# Patient Record
Sex: Female | Born: 1945 | Race: White | Hispanic: No | Smoking: Never smoker
Health system: Southern US, Community
[De-identification: ages and names within clinical notes are randomized; demographics above are authoritative.]

## PROBLEM LIST (undated history)

## (undated) DIAGNOSIS — M1990 Unspecified osteoarthritis, unspecified site: Secondary | ICD-10-CM

## (undated) DIAGNOSIS — G2581 Restless legs syndrome: Secondary | ICD-10-CM

## (undated) DIAGNOSIS — J45909 Unspecified asthma, uncomplicated: Secondary | ICD-10-CM

## (undated) DIAGNOSIS — E785 Hyperlipidemia, unspecified: Secondary | ICD-10-CM

## (undated) DIAGNOSIS — C801 Malignant (primary) neoplasm, unspecified: Secondary | ICD-10-CM

## (undated) DIAGNOSIS — I10 Essential (primary) hypertension: Secondary | ICD-10-CM

## (undated) HISTORY — PX: KNEE SURGERY: SHX244

## (undated) HISTORY — PX: SHOULDER SURGERY: SHX246

## (undated) HISTORY — PX: ABDOMINAL HYSTERECTOMY: SHX81

---

## 2016-08-22 ENCOUNTER — Emergency Department (HOSPITAL_COMMUNITY): Payer: Medicare HMO

## 2016-08-22 ENCOUNTER — Encounter (HOSPITAL_COMMUNITY): Payer: Self-pay | Admitting: Family Medicine

## 2016-08-22 ENCOUNTER — Emergency Department (HOSPITAL_COMMUNITY)
Admission: EM | Admit: 2016-08-22 | Discharge: 2016-08-23 | Disposition: A | Discharge status: Home / Self Care | Payer: Medicare HMO | Attending: Emergency Medicine | Admitting: Emergency Medicine

## 2016-08-22 DIAGNOSIS — Y999 Unspecified external cause status: Secondary | ICD-10-CM | POA: Insufficient documentation

## 2016-08-22 DIAGNOSIS — M2500 Hemarthrosis, unspecified joint: Secondary | ICD-10-CM

## 2016-08-22 DIAGNOSIS — J45909 Unspecified asthma, uncomplicated: Secondary | ICD-10-CM | POA: Diagnosis not present

## 2016-08-22 DIAGNOSIS — S76102A Unspecified injury of left quadriceps muscle, fascia and tendon, initial encounter: Secondary | ICD-10-CM | POA: Diagnosis not present

## 2016-08-22 DIAGNOSIS — S0990XA Unspecified injury of head, initial encounter: Secondary | ICD-10-CM | POA: Insufficient documentation

## 2016-08-22 DIAGNOSIS — Z96652 Presence of left artificial knee joint: Secondary | ICD-10-CM | POA: Insufficient documentation

## 2016-08-22 DIAGNOSIS — S76109A Unspecified injury of unspecified quadriceps muscle, fascia and tendon, initial encounter: Secondary | ICD-10-CM

## 2016-08-22 DIAGNOSIS — Y9301 Activity, walking, marching and hiking: Secondary | ICD-10-CM | POA: Diagnosis not present

## 2016-08-22 DIAGNOSIS — I10 Essential (primary) hypertension: Secondary | ICD-10-CM | POA: Diagnosis not present

## 2016-08-22 DIAGNOSIS — Z853 Personal history of malignant neoplasm of breast: Secondary | ICD-10-CM | POA: Insufficient documentation

## 2016-08-22 DIAGNOSIS — W0110XA Fall on same level from slipping, tripping and stumbling with subsequent striking against unspecified object, initial encounter: Secondary | ICD-10-CM | POA: Diagnosis not present

## 2016-08-22 DIAGNOSIS — S8992XA Unspecified injury of left lower leg, initial encounter: Secondary | ICD-10-CM | POA: Diagnosis present

## 2016-08-22 DIAGNOSIS — Y929 Unspecified place or not applicable: Secondary | ICD-10-CM | POA: Insufficient documentation

## 2016-08-22 HISTORY — DX: Unspecified asthma, uncomplicated: J45.909

## 2016-08-22 HISTORY — DX: Essential (primary) hypertension: I10

## 2016-08-22 HISTORY — DX: Unspecified osteoarthritis, unspecified site: M19.90

## 2016-08-22 HISTORY — DX: Restless legs syndrome: G25.81

## 2016-08-22 HISTORY — DX: Malignant (primary) neoplasm, unspecified: C80.1

## 2016-08-22 HISTORY — DX: Hyperlipidemia, unspecified: E78.5

## 2016-08-22 NOTE — ED Notes (Signed)
Bed: WA01 Expected date:  Expected time:  Means of arrival:  Comments: 71 yr old left leg pain fall

## 2016-08-22 NOTE — ED Triage Notes (Signed)
Patient is from her daughters home and transported via [facility]. Patient had a left knee replacement on 07/19/2016. Patient experienced a fall while wearing socks,. Pt is complaining of left femur pain and has pain tenderness.

## 2016-08-22 NOTE — ED Provider Notes (Addendum)
[facility] DEPT Provider Note   CSN: [id number] Arrival date & time: 08/22/16  2243  By signing my name below, I, Oleh Genin, attest that this documentation has been prepared under the direction and in the presence of Conrad, Khyran Riera, MD. Electronically Signed: Oleh Genin, Scribe. 08/22/16. 11:54 PM.   History   Chief Complaint Chief Complaint  Patient presents with  . Fall    HPI ISZABELLA HEBENSTREIT is a 71 y.o. 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 This patient states that she slipped and fell while walking with socks on hardwood flooring. She struck the back of her head. No LOC. At interview, she is primarily reporting severe pain to the L anterior, mid thigh extending to the L knee. She did have a recent procedure as noted. She states she stopped doing rehab over a week ago secondary to injury the left quadriceps muscle.  She denies any loss of function or sensation distally. She is reporting mild head pain following her fall. She takes 81mg  aspirin once daily and is taking oxycodone at home as needed for the pain.  The history is provided by the patient. No language interpreter was used.  Fall  This is a new problem. The current episode started 3 to 5 hours ago. The problem occurs constantly. The problem has not changed since onset.Pertinent negatives include no chest pain, no abdominal pain and no shortness of breath. Associated symptoms comments: L lower extremity pain. The symptoms are aggravated by standing. Nothing relieves the symptoms. She has tried nothing for the symptoms. The treatment provided no relief.    Past Medical History:  Diagnosis Date  . Arthritis   . Asthma   . 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 Hyperlipemia   . Hypertension   . Restless leg     There are no active problems to display for this patient.   Past Surgical History:  Procedure Laterality Date  . ABDOMINAL  HYSTERECTOMY    . KNEE SURGERY Left   . SHOULDER SURGERY Right     OB History    No data available       Home Medications    Prior to Admission medications   Not on File    Family History History reviewed. No pertinent family history.  Social History Social History  Substance Use Topics  . Smoking status: Never Smoker  . Smokeless tobacco: Never Used  . Alcohol use No     Allergies   Patient has no allergy information on record.   Review of Systems Review of Systems  Constitutional: Negative for chills and fever.  HENT: Negative for ear pain and sore throat.   Eyes: Negative for pain and visual disturbance.  Respiratory: Negative for cough and shortness of breath.   Cardiovascular: Negative for chest pain and palpitations.  Gastrointestinal: Negative for abdominal pain and vomiting.  Genitourinary: Negative for dysuria and hematuria.  Musculoskeletal: Negative for arthralgias and back pain.       Severe leg pain per HPI. Mild head pain  Skin: Negative for color change and rash.  Neurological: Negative for seizures and syncope.  All other systems reviewed and are negative.    Physical Exam Updated Vital Signs BP 120/61   Pulse 73   Temp 98 F (36.7 C)   Resp 20   Ht 5\' 2"  (1.575 m)   Wt 211 lb (95.7 kg)   SpO2  98% Comment: RA  BMI 38.59 kg/m   Physical Exam  Constitutional: She is oriented to person, place, and time. She appears well-developed and well-nourished. No distress.  HENT:  Head: Normocephalic and atraumatic.  Mouth/Throat: Oropharynx is clear and moist.  Eyes: Conjunctivae and EOM are normal. Pupils are equal, round, and reactive to light.  Neck: Normal range of motion. Neck supple.  Cardiovascular: Normal rate and regular rhythm.   No murmur heard. Pulmonary/Chest: Effort normal and breath sounds normal. No respiratory distress. She has no wheezes. She has no rales.  Abdominal: Soft. Bowel sounds are normal. She exhibits no mass.  There is no tenderness. There is no rebound and no guarding.  Musculoskeletal: She exhibits no edema.       Left knee: She exhibits decreased range of motion, swelling, effusion and ecchymosis. She exhibits no deformity, no laceration, no erythema, normal patellar mobility and normal meniscus.       Left upper leg: She exhibits no bony tenderness, no deformity and no laceration.  Quadriceps engages normally without balling up of the muscle   Neurological: She is alert and oriented to person, place, and time. She has normal reflexes. She displays normal reflexes. She exhibits normal muscle tone. Coordination normal.  Skin: Skin is warm and dry. Capillary refill takes less than 2 seconds.  There is a surgical scar to the L knee that is clean, dry, and intact.  Psychiatric: She has a normal mood and affect.  Nursing note and vitals reviewed.    ED Treatments / Results   Vitals:   08/22/16 2245 08/23/16 0126  BP: 120/61 122/74  Pulse: 73 74  Resp: 20 16  Temp: 98 F (36.7 C)    Radiology Dg Hip Unilat With Pelvis 2-3 Views Left  Result Date: 08/22/2016 CLINICAL DATA:  Fall with pain to the distal femur EXAM: DG HIP (WITH OR WITHOUT PELVIS) 2-3V LEFT COMPARISON:  None. FINDINGS: SI joint arthritis. Pubic symphysis and rami appear intact. Both femoral heads project in joint. No acute displaced fracture or malalignment. IMPRESSION: No definite acute osseous abnormality Electronically Signed   By: Donavan Foil M.D.   On: 08/22/2016 23:53   Dg Femur Min 2 Views Left  Result Date: 08/22/2016 CLINICAL DATA:  Pain status post fall EXAM: LEFT FEMUR 2 VIEWS COMPARISON:  07/19/2016 FINDINGS: Status post left knee replacement with intact appearing hardware. No definitive fracture is seen. There is soft tissue swelling superior to the knee in addition to knee effusion. Sub patellar lucency could relate to the presence of effusion with meniscus level. IMPRESSION: Status post left knee replacement. No  definite acute osseous abnormality. Moderate amount of suprapatellar soft tissue swelling and probable effusion. Electronically Signed   By: Donavan Foil M.D.   On: 08/22/2016 23:56    Procedures Procedures (including critical care time)  Medications Ordered in ED  Medications  fentaNYL (SUBLIMAZE) injection 50 mcg (50 mcg Intravenous Given 08/23/16 0049)  methocarbamol (ROBAXIN) 1,000 mg in dextrose 5 % 50 mL IVPB (0 mg Intravenous Stopped 08/23/16 0133)  ketorolac (TORADOL) 30 MG/ML injection 15 mg (15 mg Intravenous Given 08/23/16 0352)  oxyCODONE-acetaminophen (PERCOCET/ROXICET) 5-325 MG per tablet 2 tablet (2 tablets Oral Given 08/23/16 0352)   EDP contacted Dr. Case (patient's orthopedic surgeon).  No one on call for the group per message at their office.    EDP contact [facility] and was advised by operator that there is no on call orthopedic surgeon for [place].  3:11 AM Discussed case with Dr. Alvan Dame of Orthopedics. Advises placing the patient in a knee immobilizer. Patient is stable for discharge. Continue taking your oxycodone. Schedule followup appointment with your orthopedic surgeon this week.  Family does not want to be discharged stating they cannot get the patient into the car.  EDP explained at time of discharge that our on call orthopedic surgeon advised outpatient care.  EDP stated we would get [facility] to take patient home and I would write for a walker for the patient.  Patient has a walker, family wants patient transferred to [facility] for boarding in their ED for orthopedics at [facility].  EDP advised that we cannot transfer for boarding in the ED at [facility] without an accepting physician.    725 case d/w Dr. Zenia Resides medical director at [facility] who concurs we cannot transfer patient  Final Clinical Impressions(s) / ED Diagnoses  Quadriceps injury, possible tendon injury but less likely as there is no bulging of the muscle.  Return immediately for fever > 101, coldness  of the extremity, numbness, intractable pain, weakness, bleeding or any concerns.  Follow up with Dr. Case in the am.    The patient is nontoxic-appearing on exam and vital signs are within normal limits.   I have reviewed the triage vital signs and the nursing notes. Pertinent labs &imaging results that were available during my care of the patient were reviewed by me and considered in my medical decision making (see chart for details).  After history, exam, and medical workup I feel the patient has been appropriately medically screened and is safe for discharge home. Pertinent diagnoses were discussed with the patient. Patient was given return precautions.   I personally performed the services described in this documentation, which was scribed in my presence. The recorded information has been reviewed and is accurate.      Malyssa Maris, MD 08/23/16 [place], Kit Brubacher, MD 08/23/16 6553

## 2016-08-23 ENCOUNTER — Encounter (HOSPITAL_COMMUNITY): Payer: Self-pay | Admitting: Emergency Medicine

## 2016-08-23 ENCOUNTER — Emergency Department (HOSPITAL_COMMUNITY): Payer: Medicare HMO

## 2016-08-23 DIAGNOSIS — S76102A Unspecified injury of left quadriceps muscle, fascia and tendon, initial encounter: Secondary | ICD-10-CM | POA: Diagnosis not present

## 2016-08-23 MED ORDER — OXYCODONE-ACETAMINOPHEN 5-325 MG PO TABS
2.0000 | ORAL_TABLET | Freq: Once | ORAL | Status: AC
  Administered 2016-08-23: 2 via ORAL
  Filled 2016-08-23: qty 2

## 2016-08-23 MED ORDER — METHOCARBAMOL 1000 MG/10ML IJ SOLN
1000.0000 mg | Freq: Once | INTRAVENOUS | Status: AC
  Administered 2016-08-23: 1000 mg via INTRAVENOUS
  Filled 2016-08-23: qty 10

## 2016-08-23 MED ORDER — METHOCARBAMOL 1000 MG/10ML IJ SOLN: 1000.0000 mg | Freq: Once | INTRAMUSCULAR | Status: DC

## 2016-08-23 MED ORDER — FENTANYL CITRATE (PF) 100 MCG/2ML IJ SOLN
50.0000 ug | Freq: Once | INTRAMUSCULAR | Status: AC
  Administered 2016-08-23: 50 ug via INTRAVENOUS
  Filled 2016-08-23: qty 2

## 2016-08-23 MED ORDER — KETOROLAC TROMETHAMINE 30 MG/ML IJ SOLN
15.0000 mg | Freq: Once | INTRAMUSCULAR | Status: AC
  Administered 2016-08-23: 15 mg via INTRAVENOUS
  Filled 2016-08-23: qty 1

## 2016-08-23 MED ORDER — OXYCODONE-ACETAMINOPHEN 5-325 MG PO TABS: 1.0000 | ORAL_TABLET | Freq: Four times a day (QID) | ORAL | 0 refills | Status: AC | PRN

## 2016-08-23 NOTE — ED Notes (Signed)
Pt's daughters are concerned about her going home and they will not be able to get her to her appt in the morning

## 2016-08-23 NOTE — ED Notes (Signed)
Gave report to [place], Therapist, sports. Patient is requesting pain medication and provider has been notified.

## 2016-08-23 NOTE — ED Notes (Signed)
Waiting on a call back from dispatch to see who will take patient home

## 2016-08-23 NOTE — ED Notes (Signed)
Bed: WA22 Expected date:  Expected time:  Means of arrival:  Comments: 

## 2018-03-13 IMAGING — CR DG HIP (WITH OR WITHOUT PELVIS) 2-3V*L*
3 series · 3 of 3 positions shown · non-contrast
Comparison: None.

CLINICAL DATA: Fall with pain to the distal femur

EXAM:
DG HIP (WITH OR WITHOUT PELVIS) 2-3V LEFT

[w hip lat left]
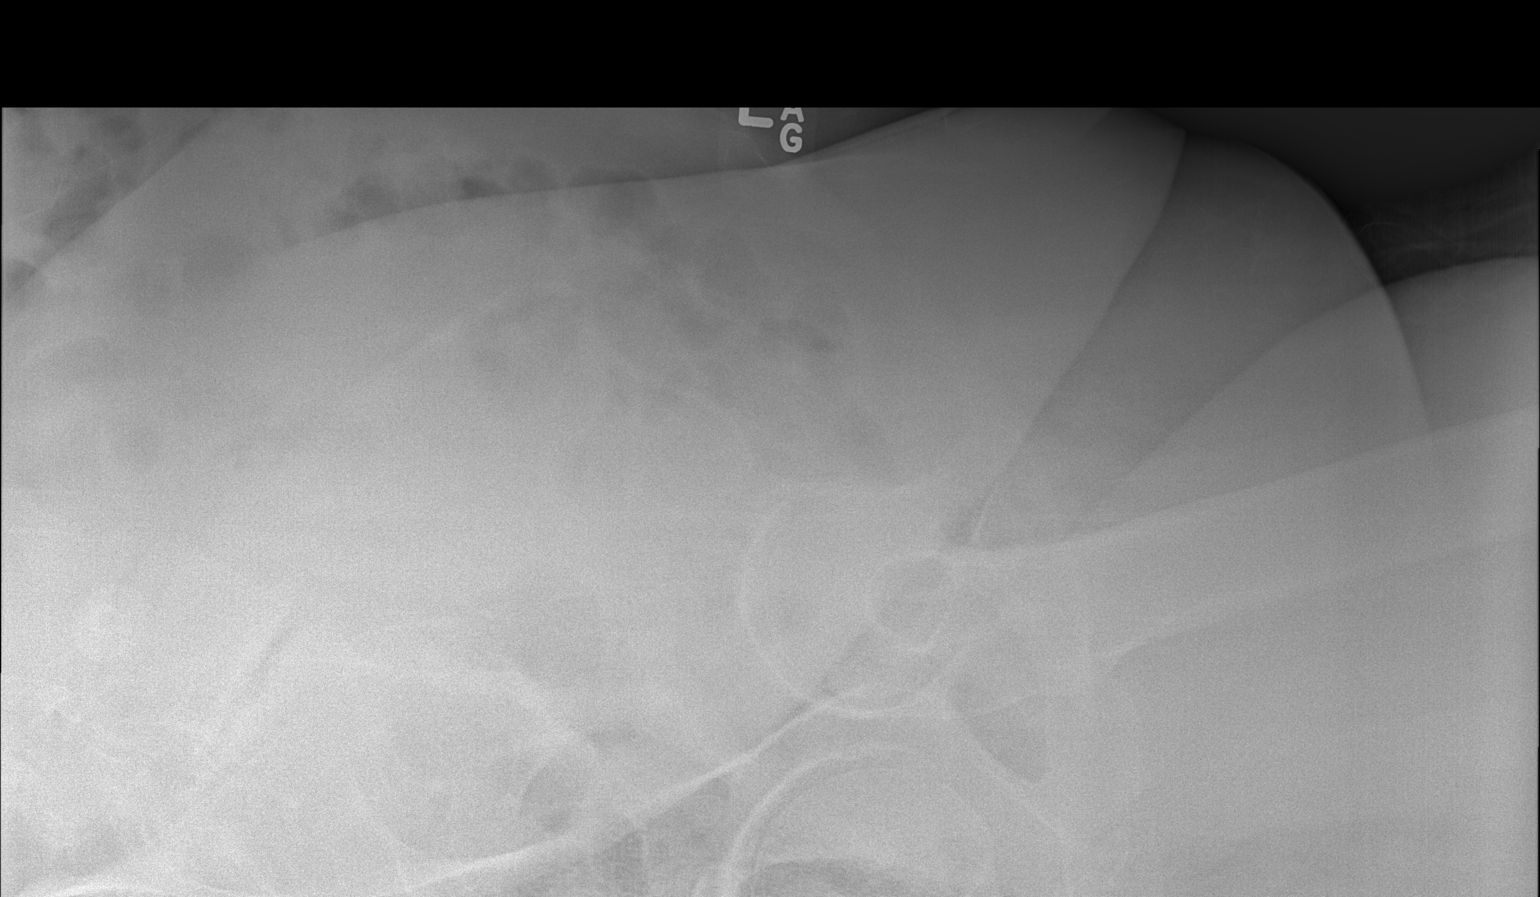

[x pelvis]
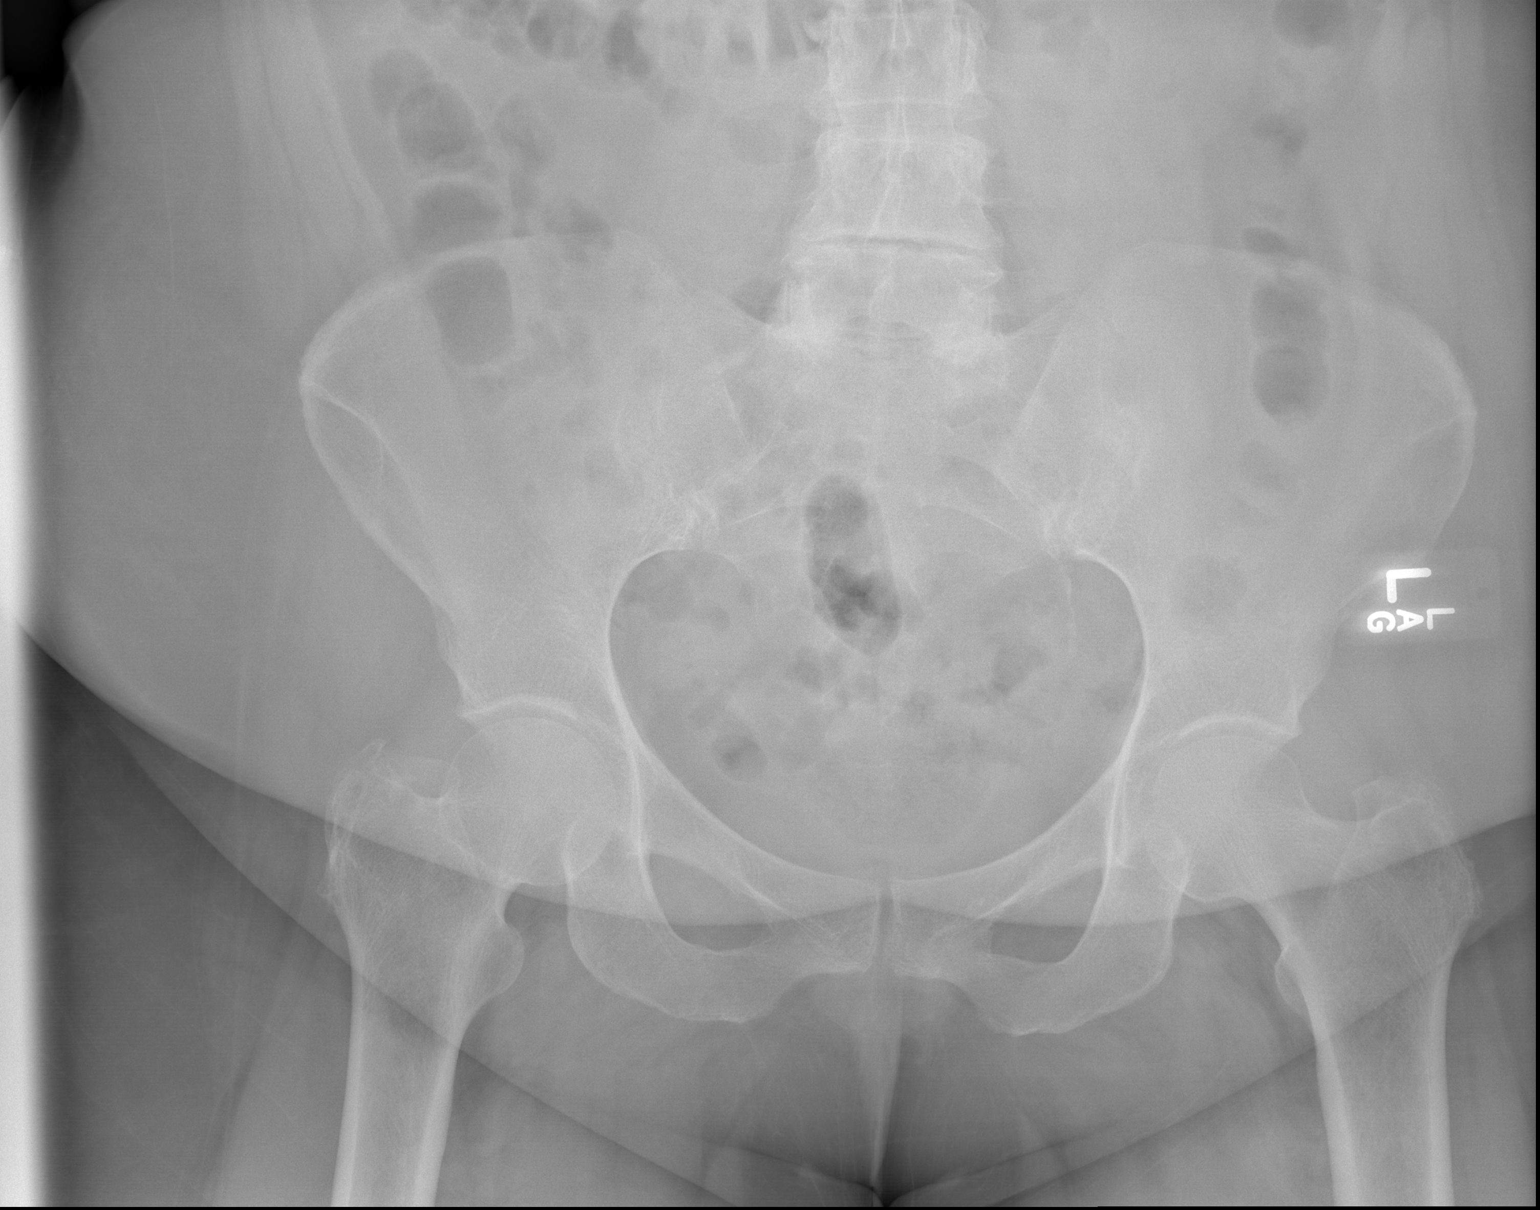

[x hip ap left]
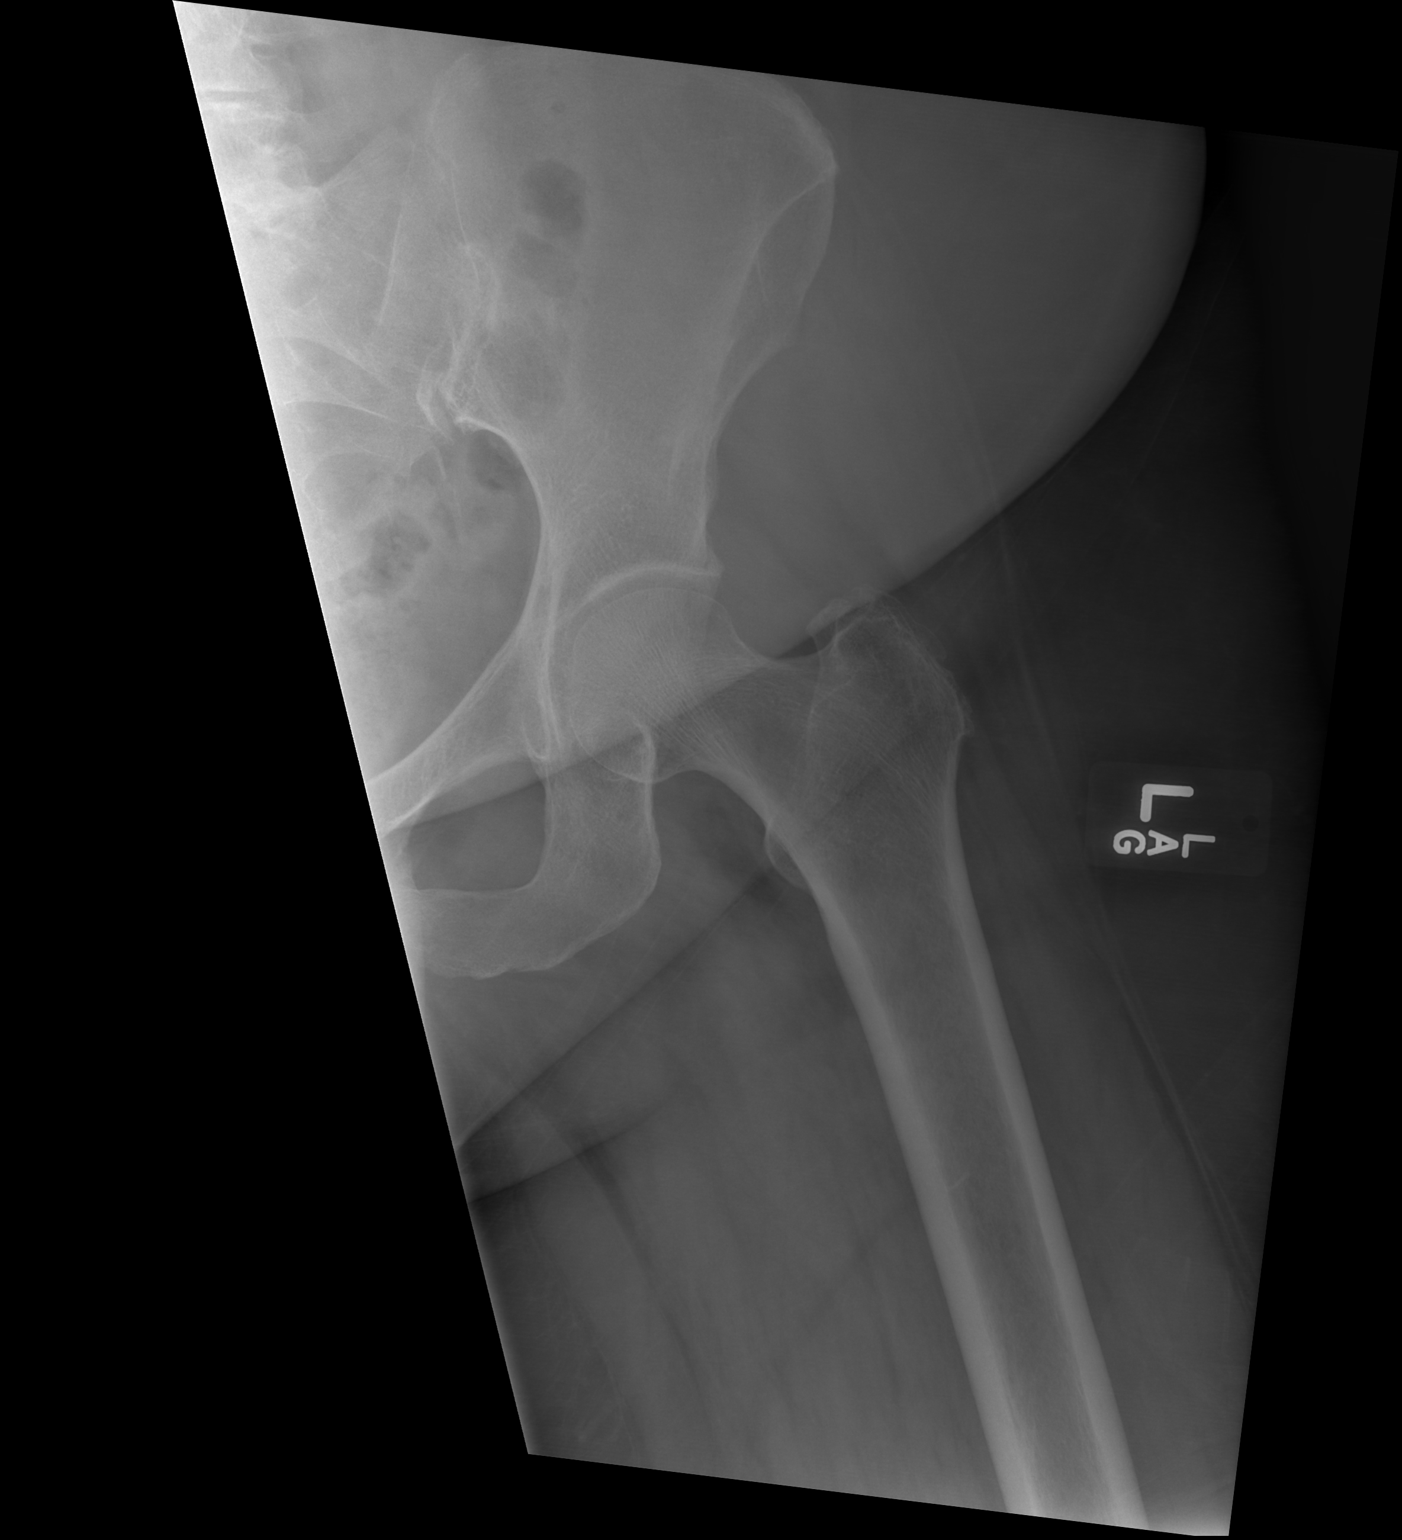

[3 of 3 positions shown; findings below may reference images not displayed]

FINDINGS: SI joint arthritis. Pubic symphysis and rami appear intact. Both
femoral heads project in joint. No acute displaced fracture or
malalignment.
IMPRESSION: No definite acute osseous abnormality

## 2018-04-12 DIAGNOSIS — J189 Pneumonia, unspecified organism: Secondary | ICD-10-CM | POA: Diagnosis not present

## 2018-04-12 DIAGNOSIS — Z6832 Body mass index (BMI) 32.0-32.9, adult: Secondary | ICD-10-CM | POA: Diagnosis not present

## 2018-04-12 DIAGNOSIS — R05 Cough: Secondary | ICD-10-CM | POA: Diagnosis not present

## 2018-04-19 DIAGNOSIS — S8002XD Contusion of left knee, subsequent encounter: Secondary | ICD-10-CM | POA: Diagnosis not present

## 2018-06-12 DIAGNOSIS — L03116 Cellulitis of left lower limb: Secondary | ICD-10-CM | POA: Diagnosis not present

## 2018-06-12 DIAGNOSIS — Z6833 Body mass index (BMI) 33.0-33.9, adult: Secondary | ICD-10-CM | POA: Diagnosis not present

## 2018-08-01 DIAGNOSIS — N183 Chronic kidney disease, stage 3 (moderate): Secondary | ICD-10-CM | POA: Diagnosis not present

## 2018-08-01 DIAGNOSIS — I10 Essential (primary) hypertension: Secondary | ICD-10-CM | POA: Diagnosis not present

## 2018-08-01 DIAGNOSIS — G2581 Restless legs syndrome: Secondary | ICD-10-CM | POA: Diagnosis not present

## 2018-08-01 DIAGNOSIS — G4733 Obstructive sleep apnea (adult) (pediatric): Secondary | ICD-10-CM | POA: Diagnosis not present

## 2018-08-01 DIAGNOSIS — E782 Mixed hyperlipidemia: Secondary | ICD-10-CM | POA: Diagnosis not present

## 2018-08-03 DIAGNOSIS — I700 Atherosclerosis of aorta: Secondary | ICD-10-CM | POA: Diagnosis not present

## 2018-08-03 DIAGNOSIS — Z6834 Body mass index (BMI) 34.0-34.9, adult: Secondary | ICD-10-CM | POA: Diagnosis not present

## 2018-08-03 DIAGNOSIS — E782 Mixed hyperlipidemia: Secondary | ICD-10-CM | POA: Diagnosis not present

## 2018-08-03 DIAGNOSIS — N183 Chronic kidney disease, stage 3 (moderate): Secondary | ICD-10-CM | POA: Diagnosis not present

## 2018-08-03 DIAGNOSIS — R05 Cough: Secondary | ICD-10-CM | POA: Diagnosis not present

## 2018-08-03 DIAGNOSIS — I10 Essential (primary) hypertension: Secondary | ICD-10-CM | POA: Diagnosis not present

## 2018-08-03 DIAGNOSIS — Z0001 Encounter for general adult medical examination with abnormal findings: Secondary | ICD-10-CM | POA: Diagnosis not present

## 2018-08-21 DIAGNOSIS — I10 Essential (primary) hypertension: Secondary | ICD-10-CM | POA: Diagnosis not present

## 2018-08-21 DIAGNOSIS — Z6834 Body mass index (BMI) 34.0-34.9, adult: Secondary | ICD-10-CM | POA: Diagnosis not present

## 2018-08-21 DIAGNOSIS — N183 Chronic kidney disease, stage 3 (moderate): Secondary | ICD-10-CM | POA: Diagnosis not present

## 2018-08-21 DIAGNOSIS — L03116 Cellulitis of left lower limb: Secondary | ICD-10-CM | POA: Diagnosis not present

## 2018-09-28 DIAGNOSIS — N183 Chronic kidney disease, stage 3 (moderate): Secondary | ICD-10-CM | POA: Diagnosis not present

## 2018-09-28 DIAGNOSIS — E782 Mixed hyperlipidemia: Secondary | ICD-10-CM | POA: Diagnosis not present

## 2018-09-28 DIAGNOSIS — I10 Essential (primary) hypertension: Secondary | ICD-10-CM | POA: Diagnosis not present

## 2018-10-03 DIAGNOSIS — N183 Chronic kidney disease, stage 3 (moderate): Secondary | ICD-10-CM | POA: Diagnosis not present

## 2018-10-03 DIAGNOSIS — E782 Mixed hyperlipidemia: Secondary | ICD-10-CM | POA: Diagnosis not present

## 2018-10-03 DIAGNOSIS — R05 Cough: Secondary | ICD-10-CM | POA: Diagnosis not present

## 2018-10-03 DIAGNOSIS — F332 Major depressive disorder, recurrent severe without psychotic features: Secondary | ICD-10-CM | POA: Diagnosis not present

## 2018-10-03 DIAGNOSIS — I10 Essential (primary) hypertension: Secondary | ICD-10-CM | POA: Diagnosis not present

## 2018-10-03 DIAGNOSIS — Z6834 Body mass index (BMI) 34.0-34.9, adult: Secondary | ICD-10-CM | POA: Diagnosis not present

## 2018-10-03 DIAGNOSIS — Z1389 Encounter for screening for other disorder: Secondary | ICD-10-CM | POA: Diagnosis not present

## 2018-10-03 DIAGNOSIS — I700 Atherosclerosis of aorta: Secondary | ICD-10-CM | POA: Diagnosis not present

## 2018-11-12 DIAGNOSIS — T84058A Periprosthetic osteolysis of other internal prosthetic joint, initial encounter: Secondary | ICD-10-CM | POA: Diagnosis not present

## 2018-11-12 DIAGNOSIS — T8454XA Infection and inflammatory reaction due to internal left knee prosthesis, initial encounter: Secondary | ICD-10-CM | POA: Diagnosis not present

## 2018-11-12 DIAGNOSIS — Z96659 Presence of unspecified artificial knee joint: Secondary | ICD-10-CM | POA: Diagnosis not present

## 2018-11-16 DIAGNOSIS — Z96659 Presence of unspecified artificial knee joint: Secondary | ICD-10-CM | POA: Diagnosis not present

## 2018-11-16 DIAGNOSIS — T8454XA Infection and inflammatory reaction due to internal left knee prosthesis, initial encounter: Secondary | ICD-10-CM | POA: Diagnosis not present

## 2018-11-16 DIAGNOSIS — T84058A Periprosthetic osteolysis of other internal prosthetic joint, initial encounter: Secondary | ICD-10-CM | POA: Diagnosis not present

## 2018-11-26 DIAGNOSIS — Z96659 Presence of unspecified artificial knee joint: Secondary | ICD-10-CM | POA: Diagnosis not present

## 2018-11-26 DIAGNOSIS — T8454XA Infection and inflammatory reaction due to internal left knee prosthesis, initial encounter: Secondary | ICD-10-CM | POA: Diagnosis not present

## 2018-11-30 DIAGNOSIS — Z96652 Presence of left artificial knee joint: Secondary | ICD-10-CM | POA: Diagnosis not present

## 2018-11-30 DIAGNOSIS — G2581 Restless legs syndrome: Secondary | ICD-10-CM | POA: Diagnosis not present

## 2018-11-30 DIAGNOSIS — E785 Hyperlipidemia, unspecified: Secondary | ICD-10-CM | POA: Diagnosis not present

## 2018-11-30 DIAGNOSIS — M1990 Unspecified osteoarthritis, unspecified site: Secondary | ICD-10-CM | POA: Diagnosis not present

## 2018-11-30 DIAGNOSIS — N183 Chronic kidney disease, stage 3 (moderate): Secondary | ICD-10-CM | POA: Diagnosis not present

## 2018-11-30 DIAGNOSIS — I129 Hypertensive chronic kidney disease with stage 1 through stage 4 chronic kidney disease, or unspecified chronic kidney disease: Secondary | ICD-10-CM | POA: Diagnosis not present

## 2018-11-30 DIAGNOSIS — Z01810 Encounter for preprocedural cardiovascular examination: Secondary | ICD-10-CM | POA: Diagnosis not present

## 2018-11-30 DIAGNOSIS — T8454XA Infection and inflammatory reaction due to internal left knee prosthesis, initial encounter: Secondary | ICD-10-CM | POA: Diagnosis not present

## 2018-11-30 DIAGNOSIS — D62 Acute posthemorrhagic anemia: Secondary | ICD-10-CM | POA: Diagnosis not present

## 2018-11-30 DIAGNOSIS — B9562 Methicillin resistant Staphylococcus aureus infection as the cause of diseases classified elsewhere: Secondary | ICD-10-CM | POA: Diagnosis not present

## 2018-12-03 DIAGNOSIS — Z96659 Presence of unspecified artificial knee joint: Secondary | ICD-10-CM | POA: Diagnosis not present

## 2018-12-03 DIAGNOSIS — Z01818 Encounter for other preprocedural examination: Secondary | ICD-10-CM | POA: Diagnosis not present

## 2018-12-03 DIAGNOSIS — T8454XA Infection and inflammatory reaction due to internal left knee prosthesis, initial encounter: Secondary | ICD-10-CM | POA: Diagnosis not present

## 2018-12-07 DIAGNOSIS — Z96652 Presence of left artificial knee joint: Secondary | ICD-10-CM | POA: Diagnosis not present

## 2018-12-07 DIAGNOSIS — N183 Chronic kidney disease, stage 3 (moderate): Secondary | ICD-10-CM | POA: Diagnosis not present

## 2018-12-07 DIAGNOSIS — M1990 Unspecified osteoarthritis, unspecified site: Secondary | ICD-10-CM | POA: Diagnosis not present

## 2018-12-07 DIAGNOSIS — I129 Hypertensive chronic kidney disease with stage 1 through stage 4 chronic kidney disease, or unspecified chronic kidney disease: Secondary | ICD-10-CM | POA: Diagnosis not present

## 2018-12-07 DIAGNOSIS — D62 Acute posthemorrhagic anemia: Secondary | ICD-10-CM | POA: Diagnosis not present

## 2018-12-07 DIAGNOSIS — T8454XA Infection and inflammatory reaction due to internal left knee prosthesis, initial encounter: Secondary | ICD-10-CM | POA: Diagnosis not present

## 2018-12-07 DIAGNOSIS — E785 Hyperlipidemia, unspecified: Secondary | ICD-10-CM | POA: Diagnosis not present

## 2018-12-07 DIAGNOSIS — G2581 Restless legs syndrome: Secondary | ICD-10-CM | POA: Diagnosis not present

## 2018-12-07 DIAGNOSIS — B9562 Methicillin resistant Staphylococcus aureus infection as the cause of diseases classified elsewhere: Secondary | ICD-10-CM | POA: Diagnosis not present

## 2018-12-11 DIAGNOSIS — Z96651 Presence of right artificial knee joint: Secondary | ICD-10-CM | POA: Diagnosis not present

## 2018-12-11 DIAGNOSIS — B9562 Methicillin resistant Staphylococcus aureus infection as the cause of diseases classified elsewhere: Secondary | ICD-10-CM | POA: Diagnosis not present

## 2018-12-11 DIAGNOSIS — E785 Hyperlipidemia, unspecified: Secondary | ICD-10-CM | POA: Diagnosis not present

## 2018-12-11 DIAGNOSIS — M1990 Unspecified osteoarthritis, unspecified site: Secondary | ICD-10-CM | POA: Diagnosis not present

## 2018-12-11 DIAGNOSIS — N179 Acute kidney failure, unspecified: Secondary | ICD-10-CM | POA: Diagnosis not present

## 2018-12-11 DIAGNOSIS — I129 Hypertensive chronic kidney disease with stage 1 through stage 4 chronic kidney disease, or unspecified chronic kidney disease: Secondary | ICD-10-CM | POA: Diagnosis not present

## 2018-12-11 DIAGNOSIS — G2581 Restless legs syndrome: Secondary | ICD-10-CM | POA: Diagnosis not present

## 2018-12-11 DIAGNOSIS — T8454XA Infection and inflammatory reaction due to internal left knee prosthesis, initial encounter: Secondary | ICD-10-CM | POA: Diagnosis not present

## 2018-12-11 DIAGNOSIS — D62 Acute posthemorrhagic anemia: Secondary | ICD-10-CM | POA: Diagnosis not present

## 2018-12-11 DIAGNOSIS — Z96652 Presence of left artificial knee joint: Secondary | ICD-10-CM | POA: Diagnosis not present

## 2018-12-11 DIAGNOSIS — N183 Chronic kidney disease, stage 3 (moderate): Secondary | ICD-10-CM | POA: Diagnosis not present

## 2018-12-11 DIAGNOSIS — M71162 Other infective bursitis, left knee: Secondary | ICD-10-CM | POA: Diagnosis not present

## 2018-12-11 DIAGNOSIS — I1310 Hypertensive heart and chronic kidney disease without heart failure, with stage 1 through stage 4 chronic kidney disease, or unspecified chronic kidney disease: Secondary | ICD-10-CM | POA: Diagnosis not present

## 2018-12-12 DIAGNOSIS — D62 Acute posthemorrhagic anemia: Secondary | ICD-10-CM | POA: Diagnosis not present

## 2018-12-12 DIAGNOSIS — I1310 Hypertensive heart and chronic kidney disease without heart failure, with stage 1 through stage 4 chronic kidney disease, or unspecified chronic kidney disease: Secondary | ICD-10-CM | POA: Diagnosis not present

## 2018-12-12 DIAGNOSIS — N179 Acute kidney failure, unspecified: Secondary | ICD-10-CM | POA: Diagnosis not present

## 2018-12-12 DIAGNOSIS — M71162 Other infective bursitis, left knee: Secondary | ICD-10-CM | POA: Diagnosis not present

## 2018-12-12 DIAGNOSIS — N183 Chronic kidney disease, stage 3 (moderate): Secondary | ICD-10-CM | POA: Diagnosis not present

## 2018-12-13 DIAGNOSIS — D62 Acute posthemorrhagic anemia: Secondary | ICD-10-CM | POA: Diagnosis not present

## 2018-12-13 DIAGNOSIS — I1310 Hypertensive heart and chronic kidney disease without heart failure, with stage 1 through stage 4 chronic kidney disease, or unspecified chronic kidney disease: Secondary | ICD-10-CM | POA: Diagnosis not present

## 2018-12-13 DIAGNOSIS — M71162 Other infective bursitis, left knee: Secondary | ICD-10-CM | POA: Diagnosis not present

## 2018-12-13 DIAGNOSIS — N183 Chronic kidney disease, stage 3 (moderate): Secondary | ICD-10-CM | POA: Diagnosis not present

## 2018-12-15 DIAGNOSIS — M6281 Muscle weakness (generalized): Secondary | ICD-10-CM | POA: Diagnosis not present

## 2018-12-15 DIAGNOSIS — Z792 Long term (current) use of antibiotics: Secondary | ICD-10-CM | POA: Diagnosis not present

## 2018-12-15 DIAGNOSIS — Z452 Encounter for adjustment and management of vascular access device: Secondary | ICD-10-CM | POA: Diagnosis not present

## 2018-12-15 DIAGNOSIS — I10 Essential (primary) hypertension: Secondary | ICD-10-CM | POA: Diagnosis not present

## 2018-12-15 DIAGNOSIS — M00869 Arthritis due to other bacteria, unspecified knee: Secondary | ICD-10-CM | POA: Diagnosis not present

## 2018-12-15 DIAGNOSIS — T8140XS Infection following a procedure, unspecified, sequela: Secondary | ICD-10-CM | POA: Diagnosis not present

## 2018-12-15 DIAGNOSIS — M00862 Arthritis due to other bacteria, left knee: Secondary | ICD-10-CM | POA: Diagnosis not present

## 2018-12-15 DIAGNOSIS — T8454XA Infection and inflammatory reaction due to internal left knee prosthesis, initial encounter: Secondary | ICD-10-CM | POA: Diagnosis not present

## 2018-12-18 DIAGNOSIS — T8454XA Infection and inflammatory reaction due to internal left knee prosthesis, initial encounter: Secondary | ICD-10-CM | POA: Diagnosis not present

## 2018-12-18 DIAGNOSIS — Z792 Long term (current) use of antibiotics: Secondary | ICD-10-CM | POA: Diagnosis not present

## 2018-12-18 DIAGNOSIS — M00862 Arthritis due to other bacteria, left knee: Secondary | ICD-10-CM | POA: Diagnosis not present

## 2018-12-18 DIAGNOSIS — Z452 Encounter for adjustment and management of vascular access device: Secondary | ICD-10-CM | POA: Diagnosis not present

## 2018-12-18 DIAGNOSIS — I10 Essential (primary) hypertension: Secondary | ICD-10-CM | POA: Diagnosis not present

## 2018-12-18 DIAGNOSIS — M6281 Muscle weakness (generalized): Secondary | ICD-10-CM | POA: Diagnosis not present

## 2018-12-20 DIAGNOSIS — Z792 Long term (current) use of antibiotics: Secondary | ICD-10-CM | POA: Diagnosis not present

## 2018-12-20 DIAGNOSIS — M6281 Muscle weakness (generalized): Secondary | ICD-10-CM | POA: Diagnosis not present

## 2018-12-20 DIAGNOSIS — T8454XA Infection and inflammatory reaction due to internal left knee prosthesis, initial encounter: Secondary | ICD-10-CM | POA: Diagnosis not present

## 2018-12-20 DIAGNOSIS — M00862 Arthritis due to other bacteria, left knee: Secondary | ICD-10-CM | POA: Diagnosis not present

## 2018-12-20 DIAGNOSIS — Z452 Encounter for adjustment and management of vascular access device: Secondary | ICD-10-CM | POA: Diagnosis not present

## 2018-12-20 DIAGNOSIS — I10 Essential (primary) hypertension: Secondary | ICD-10-CM | POA: Diagnosis not present

## 2018-12-21 DIAGNOSIS — M6281 Muscle weakness (generalized): Secondary | ICD-10-CM | POA: Diagnosis not present

## 2018-12-21 DIAGNOSIS — M00862 Arthritis due to other bacteria, left knee: Secondary | ICD-10-CM | POA: Diagnosis not present

## 2018-12-21 DIAGNOSIS — Z792 Long term (current) use of antibiotics: Secondary | ICD-10-CM | POA: Diagnosis not present

## 2018-12-21 DIAGNOSIS — T8454XA Infection and inflammatory reaction due to internal left knee prosthesis, initial encounter: Secondary | ICD-10-CM | POA: Diagnosis not present

## 2018-12-21 DIAGNOSIS — I10 Essential (primary) hypertension: Secondary | ICD-10-CM | POA: Diagnosis not present

## 2018-12-21 DIAGNOSIS — Z452 Encounter for adjustment and management of vascular access device: Secondary | ICD-10-CM | POA: Diagnosis not present

## 2018-12-22 DIAGNOSIS — T8140XS Infection following a procedure, unspecified, sequela: Secondary | ICD-10-CM | POA: Diagnosis not present

## 2018-12-22 DIAGNOSIS — M00869 Arthritis due to other bacteria, unspecified knee: Secondary | ICD-10-CM | POA: Diagnosis not present

## 2018-12-22 DIAGNOSIS — M01X62 Direct infection of left knee in infectious and parasitic diseases classified elsewhere: Secondary | ICD-10-CM | POA: Diagnosis not present

## 2018-12-24 DIAGNOSIS — Z792 Long term (current) use of antibiotics: Secondary | ICD-10-CM | POA: Diagnosis not present

## 2018-12-24 DIAGNOSIS — D62 Acute posthemorrhagic anemia: Secondary | ICD-10-CM | POA: Diagnosis not present

## 2018-12-24 DIAGNOSIS — I10 Essential (primary) hypertension: Secondary | ICD-10-CM | POA: Diagnosis not present

## 2018-12-24 DIAGNOSIS — E876 Hypokalemia: Secondary | ICD-10-CM | POA: Diagnosis not present

## 2018-12-24 DIAGNOSIS — R509 Fever, unspecified: Secondary | ICD-10-CM | POA: Diagnosis not present

## 2018-12-24 DIAGNOSIS — E785 Hyperlipidemia, unspecified: Secondary | ICD-10-CM | POA: Diagnosis not present

## 2018-12-24 DIAGNOSIS — R0989 Other specified symptoms and signs involving the circulatory and respiratory systems: Secondary | ICD-10-CM | POA: Diagnosis not present

## 2018-12-24 DIAGNOSIS — M00862 Arthritis due to other bacteria, left knee: Secondary | ICD-10-CM | POA: Diagnosis not present

## 2018-12-24 DIAGNOSIS — Z452 Encounter for adjustment and management of vascular access device: Secondary | ICD-10-CM | POA: Diagnosis not present

## 2018-12-24 DIAGNOSIS — T8454XA Infection and inflammatory reaction due to internal left knee prosthesis, initial encounter: Secondary | ICD-10-CM | POA: Diagnosis not present

## 2018-12-24 DIAGNOSIS — N183 Chronic kidney disease, stage 3 (moderate): Secondary | ICD-10-CM | POA: Diagnosis not present

## 2018-12-24 DIAGNOSIS — M25562 Pain in left knee: Secondary | ICD-10-CM | POA: Diagnosis not present

## 2018-12-24 DIAGNOSIS — M00062 Staphylococcal arthritis, left knee: Secondary | ICD-10-CM | POA: Diagnosis not present

## 2018-12-24 DIAGNOSIS — Z96652 Presence of left artificial knee joint: Secondary | ICD-10-CM | POA: Diagnosis not present

## 2018-12-24 DIAGNOSIS — I129 Hypertensive chronic kidney disease with stage 1 through stage 4 chronic kidney disease, or unspecified chronic kidney disease: Secondary | ICD-10-CM | POA: Diagnosis not present

## 2018-12-24 DIAGNOSIS — M6281 Muscle weakness (generalized): Secondary | ICD-10-CM | POA: Diagnosis not present

## 2018-12-24 DIAGNOSIS — B9561 Methicillin susceptible Staphylococcus aureus infection as the cause of diseases classified elsewhere: Secondary | ICD-10-CM | POA: Diagnosis not present

## 2018-12-25 DIAGNOSIS — E785 Hyperlipidemia, unspecified: Secondary | ICD-10-CM | POA: Diagnosis not present

## 2018-12-25 DIAGNOSIS — D649 Anemia, unspecified: Secondary | ICD-10-CM | POA: Diagnosis not present

## 2018-12-25 DIAGNOSIS — M00862 Arthritis due to other bacteria, left knee: Secondary | ICD-10-CM | POA: Diagnosis not present

## 2018-12-25 DIAGNOSIS — E876 Hypokalemia: Secondary | ICD-10-CM | POA: Diagnosis not present

## 2018-12-25 DIAGNOSIS — I1310 Hypertensive heart and chronic kidney disease without heart failure, with stage 1 through stage 4 chronic kidney disease, or unspecified chronic kidney disease: Secondary | ICD-10-CM | POA: Diagnosis not present

## 2018-12-25 DIAGNOSIS — M00062 Staphylococcal arthritis, left knee: Secondary | ICD-10-CM | POA: Diagnosis not present

## 2018-12-25 DIAGNOSIS — Z96652 Presence of left artificial knee joint: Secondary | ICD-10-CM | POA: Diagnosis not present

## 2018-12-25 DIAGNOSIS — B9561 Methicillin susceptible Staphylococcus aureus infection as the cause of diseases classified elsewhere: Secondary | ICD-10-CM | POA: Diagnosis not present

## 2018-12-25 DIAGNOSIS — N183 Chronic kidney disease, stage 3 (moderate): Secondary | ICD-10-CM | POA: Diagnosis not present

## 2018-12-25 DIAGNOSIS — I129 Hypertensive chronic kidney disease with stage 1 through stage 4 chronic kidney disease, or unspecified chronic kidney disease: Secondary | ICD-10-CM | POA: Diagnosis not present

## 2018-12-25 DIAGNOSIS — D62 Acute posthemorrhagic anemia: Secondary | ICD-10-CM | POA: Diagnosis not present

## 2018-12-25 DIAGNOSIS — A4902 Methicillin resistant Staphylococcus aureus infection, unspecified site: Secondary | ICD-10-CM | POA: Diagnosis not present

## 2018-12-25 DIAGNOSIS — T8454XA Infection and inflammatory reaction due to internal left knee prosthesis, initial encounter: Secondary | ICD-10-CM | POA: Diagnosis not present

## 2018-12-28 DIAGNOSIS — M01X62 Direct infection of left knee in infectious and parasitic diseases classified elsewhere: Secondary | ICD-10-CM | POA: Diagnosis not present

## 2018-12-28 DIAGNOSIS — T8454XA Infection and inflammatory reaction due to internal left knee prosthesis, initial encounter: Secondary | ICD-10-CM | POA: Diagnosis not present

## 2018-12-28 DIAGNOSIS — I10 Essential (primary) hypertension: Secondary | ICD-10-CM | POA: Diagnosis not present

## 2018-12-28 DIAGNOSIS — Z792 Long term (current) use of antibiotics: Secondary | ICD-10-CM | POA: Diagnosis not present

## 2018-12-28 DIAGNOSIS — Z452 Encounter for adjustment and management of vascular access device: Secondary | ICD-10-CM | POA: Diagnosis not present

## 2018-12-28 DIAGNOSIS — M6281 Muscle weakness (generalized): Secondary | ICD-10-CM | POA: Diagnosis not present

## 2018-12-28 DIAGNOSIS — T8140XS Infection following a procedure, unspecified, sequela: Secondary | ICD-10-CM | POA: Diagnosis not present

## 2018-12-28 DIAGNOSIS — M00869 Arthritis due to other bacteria, unspecified knee: Secondary | ICD-10-CM | POA: Diagnosis not present

## 2018-12-28 DIAGNOSIS — M00862 Arthritis due to other bacteria, left knee: Secondary | ICD-10-CM | POA: Diagnosis not present

## 2019-01-01 DIAGNOSIS — Z792 Long term (current) use of antibiotics: Secondary | ICD-10-CM | POA: Diagnosis not present

## 2019-01-01 DIAGNOSIS — M00862 Arthritis due to other bacteria, left knee: Secondary | ICD-10-CM | POA: Diagnosis not present

## 2019-01-01 DIAGNOSIS — M6281 Muscle weakness (generalized): Secondary | ICD-10-CM | POA: Diagnosis not present

## 2019-01-01 DIAGNOSIS — T8454XA Infection and inflammatory reaction due to internal left knee prosthesis, initial encounter: Secondary | ICD-10-CM | POA: Diagnosis not present

## 2019-01-01 DIAGNOSIS — I10 Essential (primary) hypertension: Secondary | ICD-10-CM | POA: Diagnosis not present

## 2019-01-01 DIAGNOSIS — Z452 Encounter for adjustment and management of vascular access device: Secondary | ICD-10-CM | POA: Diagnosis not present

## 2019-01-02 DIAGNOSIS — T8454XA Infection and inflammatory reaction due to internal left knee prosthesis, initial encounter: Secondary | ICD-10-CM | POA: Diagnosis not present

## 2019-01-02 DIAGNOSIS — Z792 Long term (current) use of antibiotics: Secondary | ICD-10-CM | POA: Diagnosis not present

## 2019-01-02 DIAGNOSIS — Z452 Encounter for adjustment and management of vascular access device: Secondary | ICD-10-CM | POA: Diagnosis not present

## 2019-01-02 DIAGNOSIS — M00862 Arthritis due to other bacteria, left knee: Secondary | ICD-10-CM | POA: Diagnosis not present

## 2019-01-04 DIAGNOSIS — M00869 Arthritis due to other bacteria, unspecified knee: Secondary | ICD-10-CM | POA: Diagnosis not present

## 2019-01-04 DIAGNOSIS — T8140XS Infection following a procedure, unspecified, sequela: Secondary | ICD-10-CM | POA: Diagnosis not present

## 2019-01-04 DIAGNOSIS — M01X62 Direct infection of left knee in infectious and parasitic diseases classified elsewhere: Secondary | ICD-10-CM | POA: Diagnosis not present

## 2019-01-05 DIAGNOSIS — Z90710 Acquired absence of both cervix and uterus: Secondary | ICD-10-CM | POA: Diagnosis not present

## 2019-01-05 DIAGNOSIS — I10 Essential (primary) hypertension: Secondary | ICD-10-CM | POA: Diagnosis not present

## 2019-01-05 DIAGNOSIS — E785 Hyperlipidemia, unspecified: Secondary | ICD-10-CM | POA: Diagnosis not present

## 2019-01-05 DIAGNOSIS — Z853 Personal history of malignant neoplasm of breast: Secondary | ICD-10-CM | POA: Diagnosis not present

## 2019-01-05 DIAGNOSIS — Z79899 Other long term (current) drug therapy: Secondary | ICD-10-CM | POA: Diagnosis not present

## 2019-01-05 DIAGNOSIS — T82898A Other specified complication of vascular prosthetic devices, implants and grafts, initial encounter: Secondary | ICD-10-CM | POA: Diagnosis not present

## 2019-01-07 DIAGNOSIS — M00869 Arthritis due to other bacteria, unspecified knee: Secondary | ICD-10-CM | POA: Diagnosis not present

## 2019-01-07 DIAGNOSIS — Z452 Encounter for adjustment and management of vascular access device: Secondary | ICD-10-CM | POA: Diagnosis not present

## 2019-01-07 DIAGNOSIS — M6281 Muscle weakness (generalized): Secondary | ICD-10-CM | POA: Diagnosis not present

## 2019-01-07 DIAGNOSIS — I10 Essential (primary) hypertension: Secondary | ICD-10-CM | POA: Diagnosis not present

## 2019-01-07 DIAGNOSIS — Z792 Long term (current) use of antibiotics: Secondary | ICD-10-CM | POA: Diagnosis not present

## 2019-01-07 DIAGNOSIS — Z5181 Encounter for therapeutic drug level monitoring: Secondary | ICD-10-CM | POA: Diagnosis not present

## 2019-01-07 DIAGNOSIS — T8140XS Infection following a procedure, unspecified, sequela: Secondary | ICD-10-CM | POA: Diagnosis not present

## 2019-01-07 DIAGNOSIS — T8454XA Infection and inflammatory reaction due to internal left knee prosthesis, initial encounter: Secondary | ICD-10-CM | POA: Diagnosis not present

## 2019-01-07 DIAGNOSIS — M00862 Arthritis due to other bacteria, left knee: Secondary | ICD-10-CM | POA: Diagnosis not present

## 2019-01-07 DIAGNOSIS — M01X62 Direct infection of left knee in infectious and parasitic diseases classified elsewhere: Secondary | ICD-10-CM | POA: Diagnosis not present

## 2019-01-10 DIAGNOSIS — N1832 Chronic kidney disease, stage 3b: Secondary | ICD-10-CM | POA: Diagnosis not present

## 2019-01-10 DIAGNOSIS — R809 Proteinuria, unspecified: Secondary | ICD-10-CM | POA: Diagnosis not present

## 2019-01-10 DIAGNOSIS — J449 Chronic obstructive pulmonary disease, unspecified: Secondary | ICD-10-CM | POA: Diagnosis not present

## 2019-01-10 DIAGNOSIS — I129 Hypertensive chronic kidney disease with stage 1 through stage 4 chronic kidney disease, or unspecified chronic kidney disease: Secondary | ICD-10-CM | POA: Diagnosis not present

## 2019-01-12 DIAGNOSIS — N189 Chronic kidney disease, unspecified: Secondary | ICD-10-CM | POA: Diagnosis not present

## 2019-01-12 DIAGNOSIS — T8454XA Infection and inflammatory reaction due to internal left knee prosthesis, initial encounter: Secondary | ICD-10-CM | POA: Diagnosis not present

## 2019-01-12 DIAGNOSIS — T8140XS Infection following a procedure, unspecified, sequela: Secondary | ICD-10-CM | POA: Diagnosis not present

## 2019-01-12 DIAGNOSIS — Z792 Long term (current) use of antibiotics: Secondary | ICD-10-CM | POA: Diagnosis not present

## 2019-01-12 DIAGNOSIS — Z452 Encounter for adjustment and management of vascular access device: Secondary | ICD-10-CM | POA: Diagnosis not present

## 2019-01-12 DIAGNOSIS — M01X62 Direct infection of left knee in infectious and parasitic diseases classified elsewhere: Secondary | ICD-10-CM | POA: Diagnosis not present

## 2019-01-12 DIAGNOSIS — I10 Essential (primary) hypertension: Secondary | ICD-10-CM | POA: Diagnosis not present

## 2019-01-12 DIAGNOSIS — M00869 Arthritis due to other bacteria, unspecified knee: Secondary | ICD-10-CM | POA: Diagnosis not present

## 2019-01-12 DIAGNOSIS — M00862 Arthritis due to other bacteria, left knee: Secondary | ICD-10-CM | POA: Diagnosis not present

## 2019-01-12 DIAGNOSIS — M6281 Muscle weakness (generalized): Secondary | ICD-10-CM | POA: Diagnosis not present

## 2019-01-12 DIAGNOSIS — B999 Unspecified infectious disease: Secondary | ICD-10-CM | POA: Diagnosis not present

## 2019-01-16 DIAGNOSIS — Z792 Long term (current) use of antibiotics: Secondary | ICD-10-CM | POA: Diagnosis not present

## 2019-01-16 DIAGNOSIS — I10 Essential (primary) hypertension: Secondary | ICD-10-CM | POA: Diagnosis not present

## 2019-01-16 DIAGNOSIS — M00862 Arthritis due to other bacteria, left knee: Secondary | ICD-10-CM | POA: Diagnosis not present

## 2019-01-16 DIAGNOSIS — B999 Unspecified infectious disease: Secondary | ICD-10-CM | POA: Diagnosis not present

## 2019-01-16 DIAGNOSIS — Z452 Encounter for adjustment and management of vascular access device: Secondary | ICD-10-CM | POA: Diagnosis not present

## 2019-01-16 DIAGNOSIS — T8454XA Infection and inflammatory reaction due to internal left knee prosthesis, initial encounter: Secondary | ICD-10-CM | POA: Diagnosis not present

## 2019-01-16 DIAGNOSIS — N189 Chronic kidney disease, unspecified: Secondary | ICD-10-CM | POA: Diagnosis not present

## 2019-01-16 DIAGNOSIS — M6281 Muscle weakness (generalized): Secondary | ICD-10-CM | POA: Diagnosis not present

## 2019-01-17 DIAGNOSIS — M01X62 Direct infection of left knee in infectious and parasitic diseases classified elsewhere: Secondary | ICD-10-CM | POA: Diagnosis not present

## 2019-01-17 DIAGNOSIS — T8140XS Infection following a procedure, unspecified, sequela: Secondary | ICD-10-CM | POA: Diagnosis not present

## 2019-01-17 DIAGNOSIS — M00869 Arthritis due to other bacteria, unspecified knee: Secondary | ICD-10-CM | POA: Diagnosis not present

## 2019-01-19 DIAGNOSIS — T8140XS Infection following a procedure, unspecified, sequela: Secondary | ICD-10-CM | POA: Diagnosis not present

## 2019-01-19 DIAGNOSIS — M01X62 Direct infection of left knee in infectious and parasitic diseases classified elsewhere: Secondary | ICD-10-CM | POA: Diagnosis not present

## 2019-01-19 DIAGNOSIS — M00869 Arthritis due to other bacteria, unspecified knee: Secondary | ICD-10-CM | POA: Diagnosis not present

## 2019-01-22 DIAGNOSIS — I10 Essential (primary) hypertension: Secondary | ICD-10-CM | POA: Diagnosis not present

## 2019-01-22 DIAGNOSIS — Z792 Long term (current) use of antibiotics: Secondary | ICD-10-CM | POA: Diagnosis not present

## 2019-01-22 DIAGNOSIS — M6281 Muscle weakness (generalized): Secondary | ICD-10-CM | POA: Diagnosis not present

## 2019-01-22 DIAGNOSIS — T8454XA Infection and inflammatory reaction due to internal left knee prosthesis, initial encounter: Secondary | ICD-10-CM | POA: Diagnosis not present

## 2019-01-22 DIAGNOSIS — M00862 Arthritis due to other bacteria, left knee: Secondary | ICD-10-CM | POA: Diagnosis not present

## 2019-01-22 DIAGNOSIS — Z452 Encounter for adjustment and management of vascular access device: Secondary | ICD-10-CM | POA: Diagnosis not present

## 2019-01-23 DIAGNOSIS — Z96652 Presence of left artificial knee joint: Secondary | ICD-10-CM | POA: Diagnosis not present

## 2019-01-24 DIAGNOSIS — M25562 Pain in left knee: Secondary | ICD-10-CM | POA: Diagnosis not present

## 2019-01-24 DIAGNOSIS — T8140XS Infection following a procedure, unspecified, sequela: Secondary | ICD-10-CM | POA: Diagnosis not present

## 2019-01-24 DIAGNOSIS — M01X62 Direct infection of left knee in infectious and parasitic diseases classified elsewhere: Secondary | ICD-10-CM | POA: Diagnosis not present

## 2019-01-24 DIAGNOSIS — R262 Difficulty in walking, not elsewhere classified: Secondary | ICD-10-CM | POA: Diagnosis not present

## 2019-01-24 DIAGNOSIS — Z96652 Presence of left artificial knee joint: Secondary | ICD-10-CM | POA: Diagnosis not present

## 2019-01-24 DIAGNOSIS — M00869 Arthritis due to other bacteria, unspecified knee: Secondary | ICD-10-CM | POA: Diagnosis not present

## 2019-01-26 DIAGNOSIS — D62 Acute posthemorrhagic anemia: Secondary | ICD-10-CM | POA: Diagnosis not present

## 2019-01-26 DIAGNOSIS — N179 Acute kidney failure, unspecified: Secondary | ICD-10-CM | POA: Diagnosis not present

## 2019-01-26 DIAGNOSIS — Z95828 Presence of other vascular implants and grafts: Secondary | ICD-10-CM | POA: Diagnosis not present

## 2019-01-26 DIAGNOSIS — N183 Chronic kidney disease, stage 3 unspecified: Secondary | ICD-10-CM | POA: Diagnosis not present

## 2019-01-26 DIAGNOSIS — J4520 Mild intermittent asthma, uncomplicated: Secondary | ICD-10-CM | POA: Diagnosis not present

## 2019-01-26 DIAGNOSIS — Z6835 Body mass index (BMI) 35.0-35.9, adult: Secondary | ICD-10-CM | POA: Diagnosis not present

## 2019-01-26 DIAGNOSIS — M009 Pyogenic arthritis, unspecified: Secondary | ICD-10-CM | POA: Diagnosis not present

## 2019-01-27 DIAGNOSIS — M01X62 Direct infection of left knee in infectious and parasitic diseases classified elsewhere: Secondary | ICD-10-CM | POA: Diagnosis not present

## 2019-01-27 DIAGNOSIS — T8140XS Infection following a procedure, unspecified, sequela: Secondary | ICD-10-CM | POA: Diagnosis not present

## 2019-01-27 DIAGNOSIS — M00869 Arthritis due to other bacteria, unspecified knee: Secondary | ICD-10-CM | POA: Diagnosis not present

## 2019-01-28 DIAGNOSIS — M25562 Pain in left knee: Secondary | ICD-10-CM | POA: Diagnosis not present

## 2019-01-28 DIAGNOSIS — R262 Difficulty in walking, not elsewhere classified: Secondary | ICD-10-CM | POA: Diagnosis not present

## 2019-01-28 DIAGNOSIS — Z96652 Presence of left artificial knee joint: Secondary | ICD-10-CM | POA: Diagnosis not present

## 2019-01-30 DIAGNOSIS — R262 Difficulty in walking, not elsewhere classified: Secondary | ICD-10-CM | POA: Diagnosis not present

## 2019-01-30 DIAGNOSIS — Z96652 Presence of left artificial knee joint: Secondary | ICD-10-CM | POA: Diagnosis not present

## 2019-01-30 DIAGNOSIS — M25562 Pain in left knee: Secondary | ICD-10-CM | POA: Diagnosis not present

## 2019-02-01 DIAGNOSIS — N183 Chronic kidney disease, stage 3 unspecified: Secondary | ICD-10-CM | POA: Diagnosis not present

## 2019-02-01 DIAGNOSIS — I10 Essential (primary) hypertension: Secondary | ICD-10-CM | POA: Diagnosis not present

## 2019-02-01 DIAGNOSIS — T8140XS Infection following a procedure, unspecified, sequela: Secondary | ICD-10-CM | POA: Diagnosis not present

## 2019-02-01 DIAGNOSIS — M00869 Arthritis due to other bacteria, unspecified knee: Secondary | ICD-10-CM | POA: Diagnosis not present

## 2019-02-01 DIAGNOSIS — M01X62 Direct infection of left knee in infectious and parasitic diseases classified elsewhere: Secondary | ICD-10-CM | POA: Diagnosis not present

## 2019-02-01 DIAGNOSIS — Z961 Presence of intraocular lens: Secondary | ICD-10-CM | POA: Diagnosis not present

## 2019-02-01 DIAGNOSIS — H26492 Other secondary cataract, left eye: Secondary | ICD-10-CM | POA: Diagnosis not present

## 2019-02-01 DIAGNOSIS — H524 Presbyopia: Secondary | ICD-10-CM | POA: Diagnosis not present

## 2019-02-03 DIAGNOSIS — M00869 Arthritis due to other bacteria, unspecified knee: Secondary | ICD-10-CM | POA: Diagnosis not present

## 2019-02-03 DIAGNOSIS — M01X62 Direct infection of left knee in infectious and parasitic diseases classified elsewhere: Secondary | ICD-10-CM | POA: Diagnosis not present

## 2019-02-03 DIAGNOSIS — T8140XS Infection following a procedure, unspecified, sequela: Secondary | ICD-10-CM | POA: Diagnosis not present

## 2019-02-04 DIAGNOSIS — M25562 Pain in left knee: Secondary | ICD-10-CM | POA: Diagnosis not present

## 2019-02-04 DIAGNOSIS — Z96652 Presence of left artificial knee joint: Secondary | ICD-10-CM | POA: Diagnosis not present

## 2019-02-04 DIAGNOSIS — R262 Difficulty in walking, not elsewhere classified: Secondary | ICD-10-CM | POA: Diagnosis not present

## 2019-02-06 DIAGNOSIS — B958 Unspecified staphylococcus as the cause of diseases classified elsewhere: Secondary | ICD-10-CM | POA: Diagnosis not present

## 2019-02-06 DIAGNOSIS — T8454XA Infection and inflammatory reaction due to internal left knee prosthesis, initial encounter: Secondary | ICD-10-CM | POA: Diagnosis not present

## 2019-02-06 DIAGNOSIS — Z96652 Presence of left artificial knee joint: Secondary | ICD-10-CM | POA: Diagnosis not present

## 2019-02-07 DIAGNOSIS — Z96652 Presence of left artificial knee joint: Secondary | ICD-10-CM | POA: Diagnosis not present

## 2019-02-07 DIAGNOSIS — M25562 Pain in left knee: Secondary | ICD-10-CM | POA: Diagnosis not present

## 2019-02-07 DIAGNOSIS — R262 Difficulty in walking, not elsewhere classified: Secondary | ICD-10-CM | POA: Diagnosis not present

## 2019-02-11 DIAGNOSIS — M25562 Pain in left knee: Secondary | ICD-10-CM | POA: Diagnosis not present

## 2019-02-11 DIAGNOSIS — R262 Difficulty in walking, not elsewhere classified: Secondary | ICD-10-CM | POA: Diagnosis not present

## 2019-02-11 DIAGNOSIS — Z96652 Presence of left artificial knee joint: Secondary | ICD-10-CM | POA: Diagnosis not present

## 2019-02-13 DIAGNOSIS — R262 Difficulty in walking, not elsewhere classified: Secondary | ICD-10-CM | POA: Diagnosis not present

## 2019-02-13 DIAGNOSIS — M25562 Pain in left knee: Secondary | ICD-10-CM | POA: Diagnosis not present

## 2019-02-13 DIAGNOSIS — Z96652 Presence of left artificial knee joint: Secondary | ICD-10-CM | POA: Diagnosis not present

## 2019-02-25 DIAGNOSIS — Z96652 Presence of left artificial knee joint: Secondary | ICD-10-CM | POA: Diagnosis not present

## 2019-02-25 DIAGNOSIS — T8454XA Infection and inflammatory reaction due to internal left knee prosthesis, initial encounter: Secondary | ICD-10-CM | POA: Diagnosis not present

## 2019-02-25 DIAGNOSIS — M25562 Pain in left knee: Secondary | ICD-10-CM | POA: Diagnosis not present

## 2019-02-25 DIAGNOSIS — R262 Difficulty in walking, not elsewhere classified: Secondary | ICD-10-CM | POA: Diagnosis not present

## 2019-02-27 DIAGNOSIS — R262 Difficulty in walking, not elsewhere classified: Secondary | ICD-10-CM | POA: Diagnosis not present

## 2019-02-27 DIAGNOSIS — Z96652 Presence of left artificial knee joint: Secondary | ICD-10-CM | POA: Diagnosis not present

## 2019-02-27 DIAGNOSIS — M25562 Pain in left knee: Secondary | ICD-10-CM | POA: Diagnosis not present

## 2019-03-04 DIAGNOSIS — I10 Essential (primary) hypertension: Secondary | ICD-10-CM | POA: Diagnosis not present

## 2019-03-04 DIAGNOSIS — E782 Mixed hyperlipidemia: Secondary | ICD-10-CM | POA: Diagnosis not present

## 2019-03-05 DIAGNOSIS — I10 Essential (primary) hypertension: Secondary | ICD-10-CM | POA: Diagnosis not present

## 2019-03-05 DIAGNOSIS — N183 Chronic kidney disease, stage 3 unspecified: Secondary | ICD-10-CM | POA: Diagnosis not present

## 2019-03-05 DIAGNOSIS — E782 Mixed hyperlipidemia: Secondary | ICD-10-CM | POA: Diagnosis not present

## 2019-03-05 DIAGNOSIS — E559 Vitamin D deficiency, unspecified: Secondary | ICD-10-CM | POA: Diagnosis not present

## 2019-03-05 DIAGNOSIS — K5730 Diverticulosis of large intestine without perforation or abscess without bleeding: Secondary | ICD-10-CM | POA: Diagnosis not present

## 2019-03-05 DIAGNOSIS — R5382 Chronic fatigue, unspecified: Secondary | ICD-10-CM | POA: Diagnosis not present

## 2019-03-07 DIAGNOSIS — T8454XA Infection and inflammatory reaction due to internal left knee prosthesis, initial encounter: Secondary | ICD-10-CM | POA: Diagnosis not present

## 2019-03-07 DIAGNOSIS — M069 Rheumatoid arthritis, unspecified: Secondary | ICD-10-CM | POA: Diagnosis not present

## 2019-03-07 DIAGNOSIS — B958 Unspecified staphylococcus as the cause of diseases classified elsewhere: Secondary | ICD-10-CM | POA: Diagnosis not present

## 2019-03-13 DIAGNOSIS — M009 Pyogenic arthritis, unspecified: Secondary | ICD-10-CM | POA: Diagnosis not present

## 2019-03-13 DIAGNOSIS — Z95828 Presence of other vascular implants and grafts: Secondary | ICD-10-CM | POA: Diagnosis not present

## 2019-03-13 DIAGNOSIS — J4520 Mild intermittent asthma, uncomplicated: Secondary | ICD-10-CM | POA: Diagnosis not present

## 2019-03-13 DIAGNOSIS — Z23 Encounter for immunization: Secondary | ICD-10-CM | POA: Diagnosis not present

## 2019-03-13 DIAGNOSIS — N183 Chronic kidney disease, stage 3 unspecified: Secondary | ICD-10-CM | POA: Diagnosis not present

## 2019-03-13 DIAGNOSIS — Z6834 Body mass index (BMI) 34.0-34.9, adult: Secondary | ICD-10-CM | POA: Diagnosis not present

## 2019-03-13 DIAGNOSIS — I10 Essential (primary) hypertension: Secondary | ICD-10-CM | POA: Diagnosis not present

## 2019-03-13 DIAGNOSIS — R05 Cough: Secondary | ICD-10-CM | POA: Diagnosis not present

## 2019-04-04 DIAGNOSIS — I10 Essential (primary) hypertension: Secondary | ICD-10-CM | POA: Diagnosis not present

## 2019-04-04 DIAGNOSIS — E782 Mixed hyperlipidemia: Secondary | ICD-10-CM | POA: Diagnosis not present

## 2019-04-11 DIAGNOSIS — N1831 Chronic kidney disease, stage 3a: Secondary | ICD-10-CM | POA: Diagnosis not present

## 2019-04-11 DIAGNOSIS — I129 Hypertensive chronic kidney disease with stage 1 through stage 4 chronic kidney disease, or unspecified chronic kidney disease: Secondary | ICD-10-CM | POA: Diagnosis not present

## 2019-04-11 DIAGNOSIS — J449 Chronic obstructive pulmonary disease, unspecified: Secondary | ICD-10-CM | POA: Diagnosis not present

## 2019-04-22 DIAGNOSIS — Z96652 Presence of left artificial knee joint: Secondary | ICD-10-CM | POA: Diagnosis not present

## 2019-05-03 DIAGNOSIS — E7849 Other hyperlipidemia: Secondary | ICD-10-CM | POA: Diagnosis not present

## 2019-05-03 DIAGNOSIS — I10 Essential (primary) hypertension: Secondary | ICD-10-CM | POA: Diagnosis not present

## 2019-05-31 DIAGNOSIS — E7849 Other hyperlipidemia: Secondary | ICD-10-CM | POA: Diagnosis not present

## 2019-05-31 DIAGNOSIS — I10 Essential (primary) hypertension: Secondary | ICD-10-CM | POA: Diagnosis not present

## 2019-07-03 DIAGNOSIS — I10 Essential (primary) hypertension: Secondary | ICD-10-CM | POA: Diagnosis not present

## 2019-07-03 DIAGNOSIS — E7849 Other hyperlipidemia: Secondary | ICD-10-CM | POA: Diagnosis not present

## 2019-07-03 DIAGNOSIS — J4521 Mild intermittent asthma with (acute) exacerbation: Secondary | ICD-10-CM | POA: Diagnosis not present

## 2019-08-02 DIAGNOSIS — I10 Essential (primary) hypertension: Secondary | ICD-10-CM | POA: Diagnosis not present

## 2019-08-02 DIAGNOSIS — J4521 Mild intermittent asthma with (acute) exacerbation: Secondary | ICD-10-CM | POA: Diagnosis not present

## 2019-09-02 DIAGNOSIS — I129 Hypertensive chronic kidney disease with stage 1 through stage 4 chronic kidney disease, or unspecified chronic kidney disease: Secondary | ICD-10-CM | POA: Diagnosis not present

## 2019-09-02 DIAGNOSIS — M009 Pyogenic arthritis, unspecified: Secondary | ICD-10-CM | POA: Diagnosis not present

## 2019-09-02 DIAGNOSIS — N183 Chronic kidney disease, stage 3 unspecified: Secondary | ICD-10-CM | POA: Diagnosis not present

## 2019-09-02 DIAGNOSIS — E7849 Other hyperlipidemia: Secondary | ICD-10-CM | POA: Diagnosis not present

## 2019-09-23 DIAGNOSIS — I10 Essential (primary) hypertension: Secondary | ICD-10-CM | POA: Diagnosis not present

## 2019-09-23 DIAGNOSIS — Z6837 Body mass index (BMI) 37.0-37.9, adult: Secondary | ICD-10-CM | POA: Diagnosis not present

## 2019-09-23 DIAGNOSIS — N183 Chronic kidney disease, stage 3 unspecified: Secondary | ICD-10-CM | POA: Diagnosis not present

## 2019-09-23 DIAGNOSIS — M009 Pyogenic arthritis, unspecified: Secondary | ICD-10-CM | POA: Diagnosis not present

## 2019-09-23 DIAGNOSIS — R600 Localized edema: Secondary | ICD-10-CM | POA: Diagnosis not present

## 2019-09-23 DIAGNOSIS — J4520 Mild intermittent asthma, uncomplicated: Secondary | ICD-10-CM | POA: Diagnosis not present

## 2019-09-23 DIAGNOSIS — I700 Atherosclerosis of aorta: Secondary | ICD-10-CM | POA: Diagnosis not present

## 2019-10-02 DIAGNOSIS — N183 Chronic kidney disease, stage 3 unspecified: Secondary | ICD-10-CM | POA: Diagnosis not present

## 2019-10-02 DIAGNOSIS — M009 Pyogenic arthritis, unspecified: Secondary | ICD-10-CM | POA: Diagnosis not present

## 2019-10-02 DIAGNOSIS — I129 Hypertensive chronic kidney disease with stage 1 through stage 4 chronic kidney disease, or unspecified chronic kidney disease: Secondary | ICD-10-CM | POA: Diagnosis not present

## 2019-10-02 DIAGNOSIS — E7849 Other hyperlipidemia: Secondary | ICD-10-CM | POA: Diagnosis not present

## 2019-10-09 DIAGNOSIS — M009 Pyogenic arthritis, unspecified: Secondary | ICD-10-CM | POA: Diagnosis not present

## 2019-10-09 DIAGNOSIS — M069 Rheumatoid arthritis, unspecified: Secondary | ICD-10-CM | POA: Diagnosis not present

## 2019-10-09 DIAGNOSIS — A4902 Methicillin resistant Staphylococcus aureus infection, unspecified site: Secondary | ICD-10-CM | POA: Diagnosis not present

## 2019-10-10 DIAGNOSIS — Z1331 Encounter for screening for depression: Secondary | ICD-10-CM | POA: Diagnosis not present

## 2019-10-10 DIAGNOSIS — M7542 Impingement syndrome of left shoulder: Secondary | ICD-10-CM | POA: Diagnosis not present

## 2019-10-10 DIAGNOSIS — J4520 Mild intermittent asthma, uncomplicated: Secondary | ICD-10-CM | POA: Diagnosis not present

## 2019-10-10 DIAGNOSIS — N183 Chronic kidney disease, stage 3 unspecified: Secondary | ICD-10-CM | POA: Diagnosis not present

## 2019-10-10 DIAGNOSIS — I10 Essential (primary) hypertension: Secondary | ICD-10-CM | POA: Diagnosis not present

## 2019-10-10 DIAGNOSIS — R600 Localized edema: Secondary | ICD-10-CM | POA: Diagnosis not present

## 2019-10-10 DIAGNOSIS — M009 Pyogenic arthritis, unspecified: Secondary | ICD-10-CM | POA: Diagnosis not present

## 2019-10-10 DIAGNOSIS — Z1389 Encounter for screening for other disorder: Secondary | ICD-10-CM | POA: Diagnosis not present

## 2019-10-11 DIAGNOSIS — R600 Localized edema: Secondary | ICD-10-CM | POA: Diagnosis not present

## 2019-10-11 DIAGNOSIS — R05 Cough: Secondary | ICD-10-CM | POA: Diagnosis not present

## 2019-10-14 DIAGNOSIS — Z96652 Presence of left artificial knee joint: Secondary | ICD-10-CM | POA: Diagnosis not present

## 2019-10-14 DIAGNOSIS — I129 Hypertensive chronic kidney disease with stage 1 through stage 4 chronic kidney disease, or unspecified chronic kidney disease: Secondary | ICD-10-CM | POA: Diagnosis not present

## 2019-10-14 DIAGNOSIS — M25562 Pain in left knee: Secondary | ICD-10-CM | POA: Diagnosis not present

## 2019-10-14 DIAGNOSIS — N1831 Chronic kidney disease, stage 3a: Secondary | ICD-10-CM | POA: Diagnosis not present

## 2019-11-01 DIAGNOSIS — M009 Pyogenic arthritis, unspecified: Secondary | ICD-10-CM | POA: Diagnosis not present

## 2019-11-01 DIAGNOSIS — I129 Hypertensive chronic kidney disease with stage 1 through stage 4 chronic kidney disease, or unspecified chronic kidney disease: Secondary | ICD-10-CM | POA: Diagnosis not present

## 2019-11-01 DIAGNOSIS — E7849 Other hyperlipidemia: Secondary | ICD-10-CM | POA: Diagnosis not present

## 2019-11-01 DIAGNOSIS — N183 Chronic kidney disease, stage 3 unspecified: Secondary | ICD-10-CM | POA: Diagnosis not present

## 2019-11-20 DIAGNOSIS — R05 Cough: Secondary | ICD-10-CM | POA: Diagnosis not present

## 2019-11-20 DIAGNOSIS — Z20828 Contact with and (suspected) exposure to other viral communicable diseases: Secondary | ICD-10-CM | POA: Diagnosis not present

## 2019-11-20 DIAGNOSIS — Z6836 Body mass index (BMI) 36.0-36.9, adult: Secondary | ICD-10-CM | POA: Diagnosis not present

## 2019-11-20 DIAGNOSIS — J40 Bronchitis, not specified as acute or chronic: Secondary | ICD-10-CM | POA: Diagnosis not present

## 2019-11-26 DIAGNOSIS — B958 Unspecified staphylococcus as the cause of diseases classified elsewhere: Secondary | ICD-10-CM | POA: Diagnosis not present

## 2019-11-26 DIAGNOSIS — M009 Pyogenic arthritis, unspecified: Secondary | ICD-10-CM | POA: Diagnosis not present

## 2019-11-28 DIAGNOSIS — N183 Chronic kidney disease, stage 3 unspecified: Secondary | ICD-10-CM | POA: Diagnosis not present

## 2019-11-28 DIAGNOSIS — C50911 Malignant neoplasm of unspecified site of right female breast: Secondary | ICD-10-CM | POA: Diagnosis not present

## 2019-11-28 DIAGNOSIS — R05 Cough: Secondary | ICD-10-CM | POA: Diagnosis not present

## 2019-11-28 DIAGNOSIS — J4520 Mild intermittent asthma, uncomplicated: Secondary | ICD-10-CM | POA: Diagnosis not present

## 2019-11-28 DIAGNOSIS — I10 Essential (primary) hypertension: Secondary | ICD-10-CM | POA: Diagnosis not present

## 2019-12-03 DIAGNOSIS — E7849 Other hyperlipidemia: Secondary | ICD-10-CM | POA: Diagnosis not present

## 2019-12-03 DIAGNOSIS — I129 Hypertensive chronic kidney disease with stage 1 through stage 4 chronic kidney disease, or unspecified chronic kidney disease: Secondary | ICD-10-CM | POA: Diagnosis not present

## 2019-12-03 DIAGNOSIS — N183 Chronic kidney disease, stage 3 unspecified: Secondary | ICD-10-CM | POA: Diagnosis not present

## 2019-12-03 DIAGNOSIS — M009 Pyogenic arthritis, unspecified: Secondary | ICD-10-CM | POA: Diagnosis not present

## 2019-12-16 DIAGNOSIS — R918 Other nonspecific abnormal finding of lung field: Secondary | ICD-10-CM | POA: Diagnosis not present

## 2020-01-02 DIAGNOSIS — M009 Pyogenic arthritis, unspecified: Secondary | ICD-10-CM | POA: Diagnosis not present

## 2020-01-02 DIAGNOSIS — E7849 Other hyperlipidemia: Secondary | ICD-10-CM | POA: Diagnosis not present

## 2020-01-02 DIAGNOSIS — I129 Hypertensive chronic kidney disease with stage 1 through stage 4 chronic kidney disease, or unspecified chronic kidney disease: Secondary | ICD-10-CM | POA: Diagnosis not present

## 2020-01-02 DIAGNOSIS — N183 Chronic kidney disease, stage 3 unspecified: Secondary | ICD-10-CM | POA: Diagnosis not present

## 2020-01-06 DIAGNOSIS — E559 Vitamin D deficiency, unspecified: Secondary | ICD-10-CM | POA: Diagnosis not present

## 2020-01-06 DIAGNOSIS — N183 Chronic kidney disease, stage 3 unspecified: Secondary | ICD-10-CM | POA: Diagnosis not present

## 2020-01-06 DIAGNOSIS — R5382 Chronic fatigue, unspecified: Secondary | ICD-10-CM | POA: Diagnosis not present

## 2020-01-06 DIAGNOSIS — E782 Mixed hyperlipidemia: Secondary | ICD-10-CM | POA: Diagnosis not present

## 2020-01-06 DIAGNOSIS — I10 Essential (primary) hypertension: Secondary | ICD-10-CM | POA: Diagnosis not present

## 2020-01-09 DIAGNOSIS — Z96652 Presence of left artificial knee joint: Secondary | ICD-10-CM | POA: Diagnosis not present

## 2020-01-09 DIAGNOSIS — F332 Major depressive disorder, recurrent severe without psychotic features: Secondary | ICD-10-CM | POA: Diagnosis not present

## 2020-01-09 DIAGNOSIS — R051 Acute cough: Secondary | ICD-10-CM | POA: Diagnosis not present

## 2020-01-09 DIAGNOSIS — J4520 Mild intermittent asthma, uncomplicated: Secondary | ICD-10-CM | POA: Diagnosis not present

## 2020-01-09 DIAGNOSIS — N183 Chronic kidney disease, stage 3 unspecified: Secondary | ICD-10-CM | POA: Diagnosis not present

## 2020-01-09 DIAGNOSIS — I10 Essential (primary) hypertension: Secondary | ICD-10-CM | POA: Diagnosis not present

## 2020-01-09 DIAGNOSIS — Z23 Encounter for immunization: Secondary | ICD-10-CM | POA: Diagnosis not present

## 2020-01-09 DIAGNOSIS — Z0001 Encounter for general adult medical examination with abnormal findings: Secondary | ICD-10-CM | POA: Diagnosis not present

## 2020-01-09 DIAGNOSIS — Z6836 Body mass index (BMI) 36.0-36.9, adult: Secondary | ICD-10-CM | POA: Diagnosis not present

## 2020-02-01 DIAGNOSIS — M009 Pyogenic arthritis, unspecified: Secondary | ICD-10-CM | POA: Diagnosis not present

## 2020-02-01 DIAGNOSIS — N183 Chronic kidney disease, stage 3 unspecified: Secondary | ICD-10-CM | POA: Diagnosis not present

## 2020-02-01 DIAGNOSIS — I129 Hypertensive chronic kidney disease with stage 1 through stage 4 chronic kidney disease, or unspecified chronic kidney disease: Secondary | ICD-10-CM | POA: Diagnosis not present

## 2020-02-01 DIAGNOSIS — E7849 Other hyperlipidemia: Secondary | ICD-10-CM | POA: Diagnosis not present

## 2020-03-03 DIAGNOSIS — I129 Hypertensive chronic kidney disease with stage 1 through stage 4 chronic kidney disease, or unspecified chronic kidney disease: Secondary | ICD-10-CM | POA: Diagnosis not present

## 2020-03-03 DIAGNOSIS — N183 Chronic kidney disease, stage 3 unspecified: Secondary | ICD-10-CM | POA: Diagnosis not present

## 2020-03-03 DIAGNOSIS — Z6836 Body mass index (BMI) 36.0-36.9, adult: Secondary | ICD-10-CM | POA: Diagnosis not present

## 2020-03-03 DIAGNOSIS — H1010 Acute atopic conjunctivitis, unspecified eye: Secondary | ICD-10-CM | POA: Diagnosis not present

## 2020-03-03 DIAGNOSIS — R300 Dysuria: Secondary | ICD-10-CM | POA: Diagnosis not present

## 2020-03-03 DIAGNOSIS — J302 Other seasonal allergic rhinitis: Secondary | ICD-10-CM | POA: Diagnosis not present

## 2020-03-03 DIAGNOSIS — E7849 Other hyperlipidemia: Secondary | ICD-10-CM | POA: Diagnosis not present

## 2020-03-03 DIAGNOSIS — I10 Essential (primary) hypertension: Secondary | ICD-10-CM | POA: Diagnosis not present

## 2020-03-03 DIAGNOSIS — J4520 Mild intermittent asthma, uncomplicated: Secondary | ICD-10-CM | POA: Diagnosis not present

## 2020-03-24 DIAGNOSIS — Z23 Encounter for immunization: Secondary | ICD-10-CM | POA: Diagnosis not present

## 2020-04-01 DIAGNOSIS — K219 Gastro-esophageal reflux disease without esophagitis: Secondary | ICD-10-CM | POA: Diagnosis not present

## 2020-04-01 DIAGNOSIS — R053 Chronic cough: Secondary | ICD-10-CM | POA: Diagnosis not present

## 2020-04-03 DIAGNOSIS — E7849 Other hyperlipidemia: Secondary | ICD-10-CM | POA: Diagnosis not present

## 2020-04-03 DIAGNOSIS — I129 Hypertensive chronic kidney disease with stage 1 through stage 4 chronic kidney disease, or unspecified chronic kidney disease: Secondary | ICD-10-CM | POA: Diagnosis not present

## 2020-04-03 DIAGNOSIS — N183 Chronic kidney disease, stage 3 unspecified: Secondary | ICD-10-CM | POA: Diagnosis not present

## 2020-04-06 DIAGNOSIS — N183 Chronic kidney disease, stage 3 unspecified: Secondary | ICD-10-CM | POA: Diagnosis not present

## 2020-04-09 DIAGNOSIS — E7849 Other hyperlipidemia: Secondary | ICD-10-CM | POA: Diagnosis not present

## 2020-04-09 DIAGNOSIS — Z6837 Body mass index (BMI) 37.0-37.9, adult: Secondary | ICD-10-CM | POA: Diagnosis not present

## 2020-04-09 DIAGNOSIS — R5383 Other fatigue: Secondary | ICD-10-CM | POA: Diagnosis not present

## 2020-04-09 DIAGNOSIS — I10 Essential (primary) hypertension: Secondary | ICD-10-CM | POA: Diagnosis not present

## 2020-04-09 DIAGNOSIS — E611 Iron deficiency: Secondary | ICD-10-CM | POA: Diagnosis not present

## 2020-04-09 DIAGNOSIS — R053 Chronic cough: Secondary | ICD-10-CM | POA: Diagnosis not present

## 2020-04-09 DIAGNOSIS — K219 Gastro-esophageal reflux disease without esophagitis: Secondary | ICD-10-CM | POA: Diagnosis not present

## 2020-04-09 DIAGNOSIS — R059 Cough, unspecified: Secondary | ICD-10-CM | POA: Diagnosis not present

## 2020-04-09 DIAGNOSIS — N183 Chronic kidney disease, stage 3 unspecified: Secondary | ICD-10-CM | POA: Diagnosis not present

## 2020-04-09 DIAGNOSIS — F332 Major depressive disorder, recurrent severe without psychotic features: Secondary | ICD-10-CM | POA: Diagnosis not present

## 2020-04-09 DIAGNOSIS — E782 Mixed hyperlipidemia: Secondary | ICD-10-CM | POA: Diagnosis not present

## 2020-04-09 DIAGNOSIS — J4520 Mild intermittent asthma, uncomplicated: Secondary | ICD-10-CM | POA: Diagnosis not present

## 2020-04-21 DIAGNOSIS — R054 Cough syncope: Secondary | ICD-10-CM | POA: Diagnosis not present

## 2020-04-23 DIAGNOSIS — R55 Syncope and collapse: Secondary | ICD-10-CM | POA: Diagnosis not present

## 2020-04-28 DIAGNOSIS — I4891 Unspecified atrial fibrillation: Secondary | ICD-10-CM | POA: Diagnosis not present

## 2020-04-28 DIAGNOSIS — K219 Gastro-esophageal reflux disease without esophagitis: Secondary | ICD-10-CM | POA: Diagnosis not present

## 2020-04-28 DIAGNOSIS — H903 Sensorineural hearing loss, bilateral: Secondary | ICD-10-CM | POA: Diagnosis not present

## 2020-05-02 DIAGNOSIS — M009 Pyogenic arthritis, unspecified: Secondary | ICD-10-CM | POA: Diagnosis not present

## 2020-05-02 DIAGNOSIS — E7849 Other hyperlipidemia: Secondary | ICD-10-CM | POA: Diagnosis not present

## 2020-05-02 DIAGNOSIS — I129 Hypertensive chronic kidney disease with stage 1 through stage 4 chronic kidney disease, or unspecified chronic kidney disease: Secondary | ICD-10-CM | POA: Diagnosis not present

## 2020-05-02 DIAGNOSIS — N183 Chronic kidney disease, stage 3 unspecified: Secondary | ICD-10-CM | POA: Diagnosis not present

## 2020-05-28 DIAGNOSIS — A4902 Methicillin resistant Staphylococcus aureus infection, unspecified site: Secondary | ICD-10-CM | POA: Diagnosis not present

## 2020-05-28 DIAGNOSIS — T8454XA Infection and inflammatory reaction due to internal left knee prosthesis, initial encounter: Secondary | ICD-10-CM | POA: Diagnosis not present

## 2020-06-01 DIAGNOSIS — I129 Hypertensive chronic kidney disease with stage 1 through stage 4 chronic kidney disease, or unspecified chronic kidney disease: Secondary | ICD-10-CM | POA: Diagnosis not present

## 2020-06-01 DIAGNOSIS — N183 Chronic kidney disease, stage 3 unspecified: Secondary | ICD-10-CM | POA: Diagnosis not present

## 2020-06-01 DIAGNOSIS — E7849 Other hyperlipidemia: Secondary | ICD-10-CM | POA: Diagnosis not present

## 2020-06-01 DIAGNOSIS — M009 Pyogenic arthritis, unspecified: Secondary | ICD-10-CM | POA: Diagnosis not present

## 2020-06-03 DIAGNOSIS — N1831 Chronic kidney disease, stage 3a: Secondary | ICD-10-CM | POA: Diagnosis not present

## 2020-06-03 DIAGNOSIS — I129 Hypertensive chronic kidney disease with stage 1 through stage 4 chronic kidney disease, or unspecified chronic kidney disease: Secondary | ICD-10-CM | POA: Diagnosis not present

## 2020-06-17 DIAGNOSIS — H905 Unspecified sensorineural hearing loss: Secondary | ICD-10-CM | POA: Diagnosis not present

## 2020-07-01 DIAGNOSIS — N183 Chronic kidney disease, stage 3 unspecified: Secondary | ICD-10-CM | POA: Diagnosis not present

## 2020-07-01 DIAGNOSIS — E7849 Other hyperlipidemia: Secondary | ICD-10-CM | POA: Diagnosis not present

## 2020-07-01 DIAGNOSIS — H905 Unspecified sensorineural hearing loss: Secondary | ICD-10-CM | POA: Diagnosis not present

## 2020-07-01 DIAGNOSIS — I129 Hypertensive chronic kidney disease with stage 1 through stage 4 chronic kidney disease, or unspecified chronic kidney disease: Secondary | ICD-10-CM | POA: Diagnosis not present

## 2020-07-01 DIAGNOSIS — M009 Pyogenic arthritis, unspecified: Secondary | ICD-10-CM | POA: Diagnosis not present

## 2020-07-15 DIAGNOSIS — M19049 Primary osteoarthritis, unspecified hand: Secondary | ICD-10-CM | POA: Diagnosis not present

## 2020-07-15 DIAGNOSIS — K5730 Diverticulosis of large intestine without perforation or abscess without bleeding: Secondary | ICD-10-CM | POA: Diagnosis not present

## 2020-07-15 DIAGNOSIS — E782 Mixed hyperlipidemia: Secondary | ICD-10-CM | POA: Diagnosis not present

## 2020-07-15 DIAGNOSIS — N183 Chronic kidney disease, stage 3 unspecified: Secondary | ICD-10-CM | POA: Diagnosis not present

## 2020-07-15 DIAGNOSIS — I10 Essential (primary) hypertension: Secondary | ICD-10-CM | POA: Diagnosis not present

## 2020-07-15 DIAGNOSIS — E7849 Other hyperlipidemia: Secondary | ICD-10-CM | POA: Diagnosis not present

## 2020-07-20 DIAGNOSIS — E7849 Other hyperlipidemia: Secondary | ICD-10-CM | POA: Diagnosis not present

## 2020-07-20 DIAGNOSIS — I480 Paroxysmal atrial fibrillation: Secondary | ICD-10-CM | POA: Diagnosis not present

## 2020-07-20 DIAGNOSIS — R5383 Other fatigue: Secondary | ICD-10-CM | POA: Diagnosis not present

## 2020-07-20 DIAGNOSIS — F332 Major depressive disorder, recurrent severe without psychotic features: Secondary | ICD-10-CM | POA: Diagnosis not present

## 2020-07-20 DIAGNOSIS — N183 Chronic kidney disease, stage 3 unspecified: Secondary | ICD-10-CM | POA: Diagnosis not present

## 2020-07-20 DIAGNOSIS — I10 Essential (primary) hypertension: Secondary | ICD-10-CM | POA: Diagnosis not present

## 2020-07-20 DIAGNOSIS — J4520 Mild intermittent asthma, uncomplicated: Secondary | ICD-10-CM | POA: Diagnosis not present

## 2020-07-20 DIAGNOSIS — K219 Gastro-esophageal reflux disease without esophagitis: Secondary | ICD-10-CM | POA: Diagnosis not present

## 2020-07-30 DIAGNOSIS — M810 Age-related osteoporosis without current pathological fracture: Secondary | ICD-10-CM | POA: Diagnosis not present

## 2020-07-30 DIAGNOSIS — M85852 Other specified disorders of bone density and structure, left thigh: Secondary | ICD-10-CM | POA: Diagnosis not present

## 2020-08-01 DIAGNOSIS — M009 Pyogenic arthritis, unspecified: Secondary | ICD-10-CM | POA: Diagnosis not present

## 2020-08-01 DIAGNOSIS — I129 Hypertensive chronic kidney disease with stage 1 through stage 4 chronic kidney disease, or unspecified chronic kidney disease: Secondary | ICD-10-CM | POA: Diagnosis not present

## 2020-08-01 DIAGNOSIS — E7849 Other hyperlipidemia: Secondary | ICD-10-CM | POA: Diagnosis not present

## 2020-08-01 DIAGNOSIS — N183 Chronic kidney disease, stage 3 unspecified: Secondary | ICD-10-CM | POA: Diagnosis not present

## 2020-08-24 DIAGNOSIS — E785 Hyperlipidemia, unspecified: Secondary | ICD-10-CM | POA: Diagnosis not present

## 2020-08-24 DIAGNOSIS — I480 Paroxysmal atrial fibrillation: Secondary | ICD-10-CM | POA: Diagnosis not present

## 2020-08-24 DIAGNOSIS — I4819 Other persistent atrial fibrillation: Secondary | ICD-10-CM | POA: Diagnosis not present

## 2020-08-24 DIAGNOSIS — N1832 Chronic kidney disease, stage 3b: Secondary | ICD-10-CM | POA: Diagnosis not present

## 2020-08-24 DIAGNOSIS — R296 Repeated falls: Secondary | ICD-10-CM | POA: Diagnosis not present

## 2020-08-24 DIAGNOSIS — R0683 Snoring: Secondary | ICD-10-CM | POA: Diagnosis not present

## 2020-08-24 DIAGNOSIS — Z6841 Body Mass Index (BMI) 40.0 and over, adult: Secondary | ICD-10-CM | POA: Diagnosis not present

## 2020-08-24 DIAGNOSIS — Z853 Personal history of malignant neoplasm of breast: Secondary | ICD-10-CM | POA: Diagnosis not present

## 2020-08-24 DIAGNOSIS — I440 Atrioventricular block, first degree: Secondary | ICD-10-CM | POA: Diagnosis not present

## 2020-08-24 DIAGNOSIS — Z87891 Personal history of nicotine dependence: Secondary | ICD-10-CM | POA: Diagnosis not present

## 2020-08-24 DIAGNOSIS — I10 Essential (primary) hypertension: Secondary | ICD-10-CM | POA: Diagnosis not present

## 2020-08-24 DIAGNOSIS — J4530 Mild persistent asthma, uncomplicated: Secondary | ICD-10-CM | POA: Diagnosis not present

## 2020-08-24 DIAGNOSIS — I4891 Unspecified atrial fibrillation: Secondary | ICD-10-CM | POA: Diagnosis not present

## 2020-08-31 DIAGNOSIS — I129 Hypertensive chronic kidney disease with stage 1 through stage 4 chronic kidney disease, or unspecified chronic kidney disease: Secondary | ICD-10-CM | POA: Diagnosis not present

## 2020-08-31 DIAGNOSIS — M009 Pyogenic arthritis, unspecified: Secondary | ICD-10-CM | POA: Diagnosis not present

## 2020-08-31 DIAGNOSIS — E7849 Other hyperlipidemia: Secondary | ICD-10-CM | POA: Diagnosis not present

## 2020-08-31 DIAGNOSIS — N183 Chronic kidney disease, stage 3 unspecified: Secondary | ICD-10-CM | POA: Diagnosis not present

## 2020-09-10 DIAGNOSIS — I480 Paroxysmal atrial fibrillation: Secondary | ICD-10-CM | POA: Diagnosis not present

## 2020-09-10 DIAGNOSIS — R42 Dizziness and giddiness: Secondary | ICD-10-CM | POA: Diagnosis not present

## 2020-09-10 DIAGNOSIS — R000 Tachycardia, unspecified: Secondary | ICD-10-CM | POA: Diagnosis not present

## 2020-09-10 DIAGNOSIS — I4892 Unspecified atrial flutter: Secondary | ICD-10-CM | POA: Diagnosis not present

## 2020-09-15 DIAGNOSIS — I4891 Unspecified atrial fibrillation: Secondary | ICD-10-CM | POA: Diagnosis not present

## 2020-09-18 DIAGNOSIS — Z01810 Encounter for preprocedural cardiovascular examination: Secondary | ICD-10-CM | POA: Diagnosis not present

## 2020-09-18 DIAGNOSIS — I480 Paroxysmal atrial fibrillation: Secondary | ICD-10-CM | POA: Diagnosis not present

## 2020-09-19 DIAGNOSIS — E7849 Other hyperlipidemia: Secondary | ICD-10-CM | POA: Diagnosis not present

## 2020-09-19 DIAGNOSIS — I10 Essential (primary) hypertension: Secondary | ICD-10-CM | POA: Diagnosis not present

## 2020-09-19 DIAGNOSIS — E782 Mixed hyperlipidemia: Secondary | ICD-10-CM | POA: Diagnosis not present

## 2020-09-19 DIAGNOSIS — N183 Chronic kidney disease, stage 3 unspecified: Secondary | ICD-10-CM | POA: Diagnosis not present

## 2020-10-01 DIAGNOSIS — Z95818 Presence of other cardiac implants and grafts: Secondary | ICD-10-CM | POA: Diagnosis not present

## 2020-10-01 DIAGNOSIS — K219 Gastro-esophageal reflux disease without esophagitis: Secondary | ICD-10-CM | POA: Diagnosis not present

## 2020-10-01 DIAGNOSIS — Z006 Encounter for examination for normal comparison and control in clinical research program: Secondary | ICD-10-CM | POA: Diagnosis not present

## 2020-10-01 DIAGNOSIS — F411 Generalized anxiety disorder: Secondary | ICD-10-CM | POA: Diagnosis not present

## 2020-10-01 DIAGNOSIS — M009 Pyogenic arthritis, unspecified: Secondary | ICD-10-CM | POA: Diagnosis not present

## 2020-10-01 DIAGNOSIS — J45909 Unspecified asthma, uncomplicated: Secondary | ICD-10-CM | POA: Diagnosis not present

## 2020-10-01 DIAGNOSIS — G2581 Restless legs syndrome: Secondary | ICD-10-CM | POA: Diagnosis not present

## 2020-10-01 DIAGNOSIS — E7849 Other hyperlipidemia: Secondary | ICD-10-CM | POA: Diagnosis not present

## 2020-10-01 DIAGNOSIS — I129 Hypertensive chronic kidney disease with stage 1 through stage 4 chronic kidney disease, or unspecified chronic kidney disease: Secondary | ICD-10-CM | POA: Diagnosis not present

## 2020-10-01 DIAGNOSIS — I480 Paroxysmal atrial fibrillation: Secondary | ICD-10-CM | POA: Diagnosis not present

## 2020-10-01 DIAGNOSIS — N183 Chronic kidney disease, stage 3 unspecified: Secondary | ICD-10-CM | POA: Diagnosis not present

## 2020-10-01 DIAGNOSIS — E785 Hyperlipidemia, unspecified: Secondary | ICD-10-CM | POA: Diagnosis not present

## 2020-10-08 DIAGNOSIS — N1831 Chronic kidney disease, stage 3a: Secondary | ICD-10-CM | POA: Diagnosis not present

## 2020-10-08 DIAGNOSIS — R809 Proteinuria, unspecified: Secondary | ICD-10-CM | POA: Diagnosis not present

## 2020-10-08 DIAGNOSIS — J449 Chronic obstructive pulmonary disease, unspecified: Secondary | ICD-10-CM | POA: Diagnosis not present

## 2020-10-08 DIAGNOSIS — I129 Hypertensive chronic kidney disease with stage 1 through stage 4 chronic kidney disease, or unspecified chronic kidney disease: Secondary | ICD-10-CM | POA: Diagnosis not present

## 2020-10-13 DIAGNOSIS — I10 Essential (primary) hypertension: Secondary | ICD-10-CM | POA: Diagnosis not present

## 2020-10-13 DIAGNOSIS — R9431 Abnormal electrocardiogram [ECG] [EKG]: Secondary | ICD-10-CM | POA: Diagnosis not present

## 2020-10-13 DIAGNOSIS — I480 Paroxysmal atrial fibrillation: Secondary | ICD-10-CM | POA: Diagnosis not present

## 2020-10-13 DIAGNOSIS — R0602 Shortness of breath: Secondary | ICD-10-CM | POA: Diagnosis not present

## 2020-10-15 DIAGNOSIS — R0789 Other chest pain: Secondary | ICD-10-CM | POA: Diagnosis not present

## 2020-10-16 DIAGNOSIS — R0600 Dyspnea, unspecified: Secondary | ICD-10-CM | POA: Diagnosis not present

## 2020-10-23 DIAGNOSIS — F32A Depression, unspecified: Secondary | ICD-10-CM | POA: Diagnosis not present

## 2020-10-23 DIAGNOSIS — J449 Chronic obstructive pulmonary disease, unspecified: Secondary | ICD-10-CM | POA: Diagnosis not present

## 2020-10-23 DIAGNOSIS — I480 Paroxysmal atrial fibrillation: Secondary | ICD-10-CM | POA: Diagnosis not present

## 2020-10-23 DIAGNOSIS — I130 Hypertensive heart and chronic kidney disease with heart failure and stage 1 through stage 4 chronic kidney disease, or unspecified chronic kidney disease: Secondary | ICD-10-CM | POA: Diagnosis not present

## 2020-10-23 DIAGNOSIS — J4520 Mild intermittent asthma, uncomplicated: Secondary | ICD-10-CM | POA: Diagnosis not present

## 2020-10-23 DIAGNOSIS — I482 Chronic atrial fibrillation, unspecified: Secondary | ICD-10-CM | POA: Diagnosis not present

## 2020-10-23 DIAGNOSIS — I509 Heart failure, unspecified: Secondary | ICD-10-CM | POA: Diagnosis not present

## 2020-10-23 DIAGNOSIS — I10 Essential (primary) hypertension: Secondary | ICD-10-CM | POA: Diagnosis not present

## 2020-10-23 DIAGNOSIS — M1990 Unspecified osteoarthritis, unspecified site: Secondary | ICD-10-CM | POA: Diagnosis not present

## 2020-10-23 DIAGNOSIS — E7849 Other hyperlipidemia: Secondary | ICD-10-CM | POA: Diagnosis not present

## 2020-10-23 DIAGNOSIS — E876 Hypokalemia: Secondary | ICD-10-CM | POA: Diagnosis not present

## 2020-10-23 DIAGNOSIS — N1831 Chronic kidney disease, stage 3a: Secondary | ICD-10-CM | POA: Diagnosis not present

## 2020-10-23 DIAGNOSIS — R0603 Acute respiratory distress: Secondary | ICD-10-CM | POA: Diagnosis not present

## 2020-10-23 DIAGNOSIS — I5033 Acute on chronic diastolic (congestive) heart failure: Secondary | ICD-10-CM | POA: Diagnosis not present

## 2020-10-23 DIAGNOSIS — I5023 Acute on chronic systolic (congestive) heart failure: Secondary | ICD-10-CM | POA: Diagnosis not present

## 2020-10-23 DIAGNOSIS — G2581 Restless legs syndrome: Secondary | ICD-10-CM | POA: Diagnosis not present

## 2020-10-23 DIAGNOSIS — Z95818 Presence of other cardiac implants and grafts: Secondary | ICD-10-CM | POA: Diagnosis not present

## 2020-10-23 DIAGNOSIS — N183 Chronic kidney disease, stage 3 unspecified: Secondary | ICD-10-CM | POA: Diagnosis not present

## 2020-10-23 DIAGNOSIS — R0602 Shortness of breath: Secondary | ICD-10-CM | POA: Diagnosis not present

## 2020-10-23 DIAGNOSIS — J810 Acute pulmonary edema: Secondary | ICD-10-CM | POA: Diagnosis not present

## 2020-10-23 DIAGNOSIS — R0600 Dyspnea, unspecified: Secondary | ICD-10-CM | POA: Diagnosis not present

## 2020-10-24 DIAGNOSIS — I130 Hypertensive heart and chronic kidney disease with heart failure and stage 1 through stage 4 chronic kidney disease, or unspecified chronic kidney disease: Secondary | ICD-10-CM | POA: Diagnosis not present

## 2020-10-24 DIAGNOSIS — N1831 Chronic kidney disease, stage 3a: Secondary | ICD-10-CM | POA: Diagnosis not present

## 2020-10-24 DIAGNOSIS — R0602 Shortness of breath: Secondary | ICD-10-CM | POA: Diagnosis not present

## 2020-10-24 DIAGNOSIS — J449 Chronic obstructive pulmonary disease, unspecified: Secondary | ICD-10-CM | POA: Diagnosis not present

## 2020-10-24 DIAGNOSIS — I5033 Acute on chronic diastolic (congestive) heart failure: Secondary | ICD-10-CM | POA: Diagnosis not present

## 2020-10-25 DIAGNOSIS — N1831 Chronic kidney disease, stage 3a: Secondary | ICD-10-CM | POA: Diagnosis not present

## 2020-10-25 DIAGNOSIS — J449 Chronic obstructive pulmonary disease, unspecified: Secondary | ICD-10-CM | POA: Diagnosis not present

## 2020-10-25 DIAGNOSIS — I130 Hypertensive heart and chronic kidney disease with heart failure and stage 1 through stage 4 chronic kidney disease, or unspecified chronic kidney disease: Secondary | ICD-10-CM | POA: Diagnosis not present

## 2020-10-25 DIAGNOSIS — I509 Heart failure, unspecified: Secondary | ICD-10-CM | POA: Diagnosis not present

## 2020-10-25 DIAGNOSIS — R0602 Shortness of breath: Secondary | ICD-10-CM | POA: Diagnosis not present

## 2020-10-25 DIAGNOSIS — I5033 Acute on chronic diastolic (congestive) heart failure: Secondary | ICD-10-CM | POA: Diagnosis not present

## 2020-10-26 DIAGNOSIS — R0602 Shortness of breath: Secondary | ICD-10-CM | POA: Diagnosis not present

## 2020-10-26 DIAGNOSIS — I5033 Acute on chronic diastolic (congestive) heart failure: Secondary | ICD-10-CM | POA: Diagnosis not present

## 2020-11-01 DIAGNOSIS — N183 Chronic kidney disease, stage 3 unspecified: Secondary | ICD-10-CM | POA: Diagnosis not present

## 2020-11-01 DIAGNOSIS — I129 Hypertensive chronic kidney disease with stage 1 through stage 4 chronic kidney disease, or unspecified chronic kidney disease: Secondary | ICD-10-CM | POA: Diagnosis not present

## 2020-11-01 DIAGNOSIS — M009 Pyogenic arthritis, unspecified: Secondary | ICD-10-CM | POA: Diagnosis not present

## 2020-11-01 DIAGNOSIS — E7849 Other hyperlipidemia: Secondary | ICD-10-CM | POA: Diagnosis not present

## 2020-11-02 DIAGNOSIS — Z95818 Presence of other cardiac implants and grafts: Secondary | ICD-10-CM | POA: Diagnosis not present

## 2020-11-02 DIAGNOSIS — J4530 Mild persistent asthma, uncomplicated: Secondary | ICD-10-CM | POA: Diagnosis not present

## 2020-11-02 DIAGNOSIS — I10 Essential (primary) hypertension: Secondary | ICD-10-CM | POA: Diagnosis not present

## 2020-11-02 DIAGNOSIS — N1832 Chronic kidney disease, stage 3b: Secondary | ICD-10-CM | POA: Diagnosis not present

## 2020-11-02 DIAGNOSIS — I480 Paroxysmal atrial fibrillation: Secondary | ICD-10-CM | POA: Diagnosis not present

## 2020-11-02 DIAGNOSIS — I5020 Unspecified systolic (congestive) heart failure: Secondary | ICD-10-CM | POA: Diagnosis not present

## 2020-11-03 DIAGNOSIS — I480 Paroxysmal atrial fibrillation: Secondary | ICD-10-CM | POA: Diagnosis not present

## 2020-11-03 DIAGNOSIS — I110 Hypertensive heart disease with heart failure: Secondary | ICD-10-CM | POA: Diagnosis not present

## 2020-11-03 DIAGNOSIS — I517 Cardiomegaly: Secondary | ICD-10-CM | POA: Diagnosis not present

## 2020-11-03 DIAGNOSIS — R0600 Dyspnea, unspecified: Secondary | ICD-10-CM | POA: Diagnosis not present

## 2020-11-03 DIAGNOSIS — E7849 Other hyperlipidemia: Secondary | ICD-10-CM | POA: Diagnosis not present

## 2020-11-03 DIAGNOSIS — F332 Major depressive disorder, recurrent severe without psychotic features: Secondary | ICD-10-CM | POA: Diagnosis not present

## 2020-11-03 DIAGNOSIS — Z20822 Contact with and (suspected) exposure to covid-19: Secondary | ICD-10-CM | POA: Diagnosis not present

## 2020-11-03 DIAGNOSIS — E876 Hypokalemia: Secondary | ICD-10-CM | POA: Diagnosis not present

## 2020-11-03 DIAGNOSIS — I509 Heart failure, unspecified: Secondary | ICD-10-CM | POA: Diagnosis not present

## 2020-11-03 DIAGNOSIS — I700 Atherosclerosis of aorta: Secondary | ICD-10-CM | POA: Diagnosis not present

## 2020-11-03 DIAGNOSIS — I447 Left bundle-branch block, unspecified: Secondary | ICD-10-CM | POA: Diagnosis not present

## 2020-11-03 DIAGNOSIS — M25511 Pain in right shoulder: Secondary | ICD-10-CM | POA: Diagnosis not present

## 2020-11-03 DIAGNOSIS — N183 Chronic kidney disease, stage 3 unspecified: Secondary | ICD-10-CM | POA: Diagnosis not present

## 2020-11-03 DIAGNOSIS — T50901A Poisoning by unspecified drugs, medicaments and biological substances, accidental (unintentional), initial encounter: Secondary | ICD-10-CM | POA: Diagnosis not present

## 2020-11-03 DIAGNOSIS — I10 Essential (primary) hypertension: Secondary | ICD-10-CM | POA: Diagnosis not present

## 2020-11-03 DIAGNOSIS — I959 Hypotension, unspecified: Secondary | ICD-10-CM | POA: Diagnosis not present

## 2020-11-03 DIAGNOSIS — R9431 Abnormal electrocardiogram [ECG] [EKG]: Secondary | ICD-10-CM | POA: Diagnosis not present

## 2020-11-03 DIAGNOSIS — U071 COVID-19: Secondary | ICD-10-CM | POA: Diagnosis not present

## 2020-11-03 DIAGNOSIS — J4520 Mild intermittent asthma, uncomplicated: Secondary | ICD-10-CM | POA: Diagnosis not present

## 2020-11-03 DIAGNOSIS — I5021 Acute systolic (congestive) heart failure: Secondary | ICD-10-CM | POA: Diagnosis not present

## 2020-11-03 DIAGNOSIS — M25512 Pain in left shoulder: Secondary | ICD-10-CM | POA: Diagnosis not present

## 2020-11-03 DIAGNOSIS — Z95818 Presence of other cardiac implants and grafts: Secondary | ICD-10-CM | POA: Diagnosis not present

## 2020-11-03 DIAGNOSIS — T50905A Adverse effect of unspecified drugs, medicaments and biological substances, initial encounter: Secondary | ICD-10-CM | POA: Diagnosis not present

## 2020-11-03 DIAGNOSIS — J90 Pleural effusion, not elsewhere classified: Secondary | ICD-10-CM | POA: Diagnosis not present

## 2020-11-10 DIAGNOSIS — N1831 Chronic kidney disease, stage 3a: Secondary | ICD-10-CM | POA: Diagnosis not present

## 2020-11-10 DIAGNOSIS — I129 Hypertensive chronic kidney disease with stage 1 through stage 4 chronic kidney disease, or unspecified chronic kidney disease: Secondary | ICD-10-CM | POA: Diagnosis not present

## 2020-11-10 DIAGNOSIS — I5042 Chronic combined systolic (congestive) and diastolic (congestive) heart failure: Secondary | ICD-10-CM | POA: Diagnosis not present

## 2020-11-11 DIAGNOSIS — R0600 Dyspnea, unspecified: Secondary | ICD-10-CM | POA: Diagnosis not present

## 2020-11-11 DIAGNOSIS — Z87891 Personal history of nicotine dependence: Secondary | ICD-10-CM | POA: Diagnosis not present

## 2020-11-11 DIAGNOSIS — R0609 Other forms of dyspnea: Secondary | ICD-10-CM | POA: Diagnosis not present

## 2020-11-17 DIAGNOSIS — R0602 Shortness of breath: Secondary | ICD-10-CM | POA: Diagnosis not present

## 2020-11-19 DIAGNOSIS — R942 Abnormal results of pulmonary function studies: Secondary | ICD-10-CM | POA: Diagnosis not present

## 2020-11-19 DIAGNOSIS — I5022 Chronic systolic (congestive) heart failure: Secondary | ICD-10-CM | POA: Diagnosis not present

## 2020-11-19 DIAGNOSIS — Z95818 Presence of other cardiac implants and grafts: Secondary | ICD-10-CM | POA: Diagnosis not present

## 2020-11-19 DIAGNOSIS — Z87891 Personal history of nicotine dependence: Secondary | ICD-10-CM | POA: Diagnosis not present

## 2020-11-19 DIAGNOSIS — Z79899 Other long term (current) drug therapy: Secondary | ICD-10-CM | POA: Diagnosis not present

## 2020-11-19 DIAGNOSIS — G4734 Idiopathic sleep related nonobstructive alveolar hypoventilation: Secondary | ICD-10-CM | POA: Diagnosis not present

## 2020-11-19 DIAGNOSIS — I130 Hypertensive heart and chronic kidney disease with heart failure and stage 1 through stage 4 chronic kidney disease, or unspecified chronic kidney disease: Secondary | ICD-10-CM | POA: Diagnosis not present

## 2020-11-19 DIAGNOSIS — N1832 Chronic kidney disease, stage 3b: Secondary | ICD-10-CM | POA: Diagnosis not present

## 2020-11-20 DIAGNOSIS — I5032 Chronic diastolic (congestive) heart failure: Secondary | ICD-10-CM | POA: Diagnosis not present

## 2020-12-02 DIAGNOSIS — M009 Pyogenic arthritis, unspecified: Secondary | ICD-10-CM | POA: Diagnosis not present

## 2020-12-02 DIAGNOSIS — E7849 Other hyperlipidemia: Secondary | ICD-10-CM | POA: Diagnosis not present

## 2020-12-02 DIAGNOSIS — N183 Chronic kidney disease, stage 3 unspecified: Secondary | ICD-10-CM | POA: Diagnosis not present

## 2020-12-02 DIAGNOSIS — I129 Hypertensive chronic kidney disease with stage 1 through stage 4 chronic kidney disease, or unspecified chronic kidney disease: Secondary | ICD-10-CM | POA: Diagnosis not present

## 2020-12-04 DIAGNOSIS — R0609 Other forms of dyspnea: Secondary | ICD-10-CM | POA: Diagnosis not present

## 2020-12-04 DIAGNOSIS — J4550 Severe persistent asthma, uncomplicated: Secondary | ICD-10-CM | POA: Diagnosis not present

## 2020-12-04 DIAGNOSIS — M069 Rheumatoid arthritis, unspecified: Secondary | ICD-10-CM | POA: Diagnosis not present

## 2020-12-04 DIAGNOSIS — J309 Allergic rhinitis, unspecified: Secondary | ICD-10-CM | POA: Diagnosis not present

## 2020-12-04 DIAGNOSIS — B370 Candidal stomatitis: Secondary | ICD-10-CM | POA: Diagnosis not present

## 2020-12-04 DIAGNOSIS — R6889 Other general symptoms and signs: Secondary | ICD-10-CM | POA: Diagnosis not present

## 2020-12-04 DIAGNOSIS — R053 Chronic cough: Secondary | ICD-10-CM | POA: Diagnosis not present

## 2020-12-04 DIAGNOSIS — E669 Obesity, unspecified: Secondary | ICD-10-CM | POA: Diagnosis not present

## 2020-12-04 DIAGNOSIS — Z87891 Personal history of nicotine dependence: Secondary | ICD-10-CM | POA: Diagnosis not present

## 2020-12-11 DIAGNOSIS — J4530 Mild persistent asthma, uncomplicated: Secondary | ICD-10-CM | POA: Diagnosis not present

## 2020-12-11 DIAGNOSIS — I5020 Unspecified systolic (congestive) heart failure: Secondary | ICD-10-CM | POA: Diagnosis not present

## 2020-12-11 DIAGNOSIS — N1832 Chronic kidney disease, stage 3b: Secondary | ICD-10-CM | POA: Diagnosis not present

## 2020-12-11 DIAGNOSIS — R001 Bradycardia, unspecified: Secondary | ICD-10-CM | POA: Diagnosis not present

## 2020-12-11 DIAGNOSIS — G4734 Idiopathic sleep related nonobstructive alveolar hypoventilation: Secondary | ICD-10-CM | POA: Diagnosis not present

## 2020-12-11 DIAGNOSIS — I10 Essential (primary) hypertension: Secondary | ICD-10-CM | POA: Diagnosis not present

## 2020-12-11 DIAGNOSIS — I480 Paroxysmal atrial fibrillation: Secondary | ICD-10-CM | POA: Diagnosis not present

## 2020-12-14 DIAGNOSIS — J4550 Severe persistent asthma, uncomplicated: Secondary | ICD-10-CM | POA: Diagnosis not present

## 2020-12-20 DIAGNOSIS — I4891 Unspecified atrial fibrillation: Secondary | ICD-10-CM | POA: Diagnosis not present

## 2020-12-21 DIAGNOSIS — I5032 Chronic diastolic (congestive) heart failure: Secondary | ICD-10-CM | POA: Diagnosis not present

## 2020-12-31 DIAGNOSIS — J309 Allergic rhinitis, unspecified: Secondary | ICD-10-CM | POA: Diagnosis not present

## 2020-12-31 DIAGNOSIS — Z87891 Personal history of nicotine dependence: Secondary | ICD-10-CM | POA: Diagnosis not present

## 2020-12-31 DIAGNOSIS — R0609 Other forms of dyspnea: Secondary | ICD-10-CM | POA: Diagnosis not present

## 2020-12-31 DIAGNOSIS — J4550 Severe persistent asthma, uncomplicated: Secondary | ICD-10-CM | POA: Diagnosis not present

## 2020-12-31 DIAGNOSIS — R6889 Other general symptoms and signs: Secondary | ICD-10-CM | POA: Diagnosis not present

## 2020-12-31 DIAGNOSIS — E669 Obesity, unspecified: Secondary | ICD-10-CM | POA: Diagnosis not present

## 2020-12-31 DIAGNOSIS — B370 Candidal stomatitis: Secondary | ICD-10-CM | POA: Diagnosis not present

## 2020-12-31 DIAGNOSIS — R053 Chronic cough: Secondary | ICD-10-CM | POA: Diagnosis not present

## 2021-01-20 DIAGNOSIS — I5032 Chronic diastolic (congestive) heart failure: Secondary | ICD-10-CM | POA: Diagnosis not present

## 2021-01-25 DIAGNOSIS — N1832 Chronic kidney disease, stage 3b: Secondary | ICD-10-CM | POA: Diagnosis not present

## 2021-01-25 DIAGNOSIS — I110 Hypertensive heart disease with heart failure: Secondary | ICD-10-CM | POA: Diagnosis not present

## 2021-01-25 DIAGNOSIS — J4530 Mild persistent asthma, uncomplicated: Secondary | ICD-10-CM | POA: Diagnosis not present

## 2021-01-25 DIAGNOSIS — I4891 Unspecified atrial fibrillation: Secondary | ICD-10-CM | POA: Diagnosis not present

## 2021-01-25 DIAGNOSIS — I5020 Unspecified systolic (congestive) heart failure: Secondary | ICD-10-CM | POA: Diagnosis not present

## 2021-01-27 DIAGNOSIS — Z23 Encounter for immunization: Secondary | ICD-10-CM | POA: Diagnosis not present

## 2021-02-01 DIAGNOSIS — Z87891 Personal history of nicotine dependence: Secondary | ICD-10-CM | POA: Diagnosis not present

## 2021-02-01 DIAGNOSIS — J309 Allergic rhinitis, unspecified: Secondary | ICD-10-CM | POA: Diagnosis not present

## 2021-02-01 DIAGNOSIS — M009 Pyogenic arthritis, unspecified: Secondary | ICD-10-CM | POA: Diagnosis not present

## 2021-02-01 DIAGNOSIS — N183 Chronic kidney disease, stage 3 unspecified: Secondary | ICD-10-CM | POA: Diagnosis not present

## 2021-02-01 DIAGNOSIS — R6889 Other general symptoms and signs: Secondary | ICD-10-CM | POA: Diagnosis not present

## 2021-02-01 DIAGNOSIS — R0609 Other forms of dyspnea: Secondary | ICD-10-CM | POA: Diagnosis not present

## 2021-02-01 DIAGNOSIS — R053 Chronic cough: Secondary | ICD-10-CM | POA: Diagnosis not present

## 2021-02-01 DIAGNOSIS — B370 Candidal stomatitis: Secondary | ICD-10-CM | POA: Diagnosis not present

## 2021-02-01 DIAGNOSIS — E669 Obesity, unspecified: Secondary | ICD-10-CM | POA: Diagnosis not present

## 2021-02-01 DIAGNOSIS — E7849 Other hyperlipidemia: Secondary | ICD-10-CM | POA: Diagnosis not present

## 2021-02-01 DIAGNOSIS — I129 Hypertensive chronic kidney disease with stage 1 through stage 4 chronic kidney disease, or unspecified chronic kidney disease: Secondary | ICD-10-CM | POA: Diagnosis not present

## 2021-02-01 DIAGNOSIS — J4550 Severe persistent asthma, uncomplicated: Secondary | ICD-10-CM | POA: Diagnosis not present

## 2021-02-11 DIAGNOSIS — N1831 Chronic kidney disease, stage 3a: Secondary | ICD-10-CM | POA: Diagnosis not present

## 2021-02-11 DIAGNOSIS — I5042 Chronic combined systolic (congestive) and diastolic (congestive) heart failure: Secondary | ICD-10-CM | POA: Diagnosis not present

## 2021-02-11 DIAGNOSIS — I129 Hypertensive chronic kidney disease with stage 1 through stage 4 chronic kidney disease, or unspecified chronic kidney disease: Secondary | ICD-10-CM | POA: Diagnosis not present

## 2021-02-20 DIAGNOSIS — I5032 Chronic diastolic (congestive) heart failure: Secondary | ICD-10-CM | POA: Diagnosis not present

## 2021-02-24 DIAGNOSIS — I083 Combined rheumatic disorders of mitral, aortic and tricuspid valves: Secondary | ICD-10-CM | POA: Diagnosis not present

## 2021-03-10 DIAGNOSIS — J4550 Severe persistent asthma, uncomplicated: Secondary | ICD-10-CM | POA: Diagnosis not present

## 2021-03-22 DIAGNOSIS — I5032 Chronic diastolic (congestive) heart failure: Secondary | ICD-10-CM | POA: Diagnosis not present

## 2021-03-31 DIAGNOSIS — J4550 Severe persistent asthma, uncomplicated: Secondary | ICD-10-CM | POA: Diagnosis not present

## 2021-04-06 DIAGNOSIS — R0609 Other forms of dyspnea: Secondary | ICD-10-CM | POA: Diagnosis not present

## 2021-04-06 DIAGNOSIS — R053 Chronic cough: Secondary | ICD-10-CM | POA: Diagnosis not present

## 2021-04-06 DIAGNOSIS — M069 Rheumatoid arthritis, unspecified: Secondary | ICD-10-CM | POA: Diagnosis not present

## 2021-04-06 DIAGNOSIS — R6889 Other general symptoms and signs: Secondary | ICD-10-CM | POA: Diagnosis not present

## 2021-04-06 DIAGNOSIS — J4550 Severe persistent asthma, uncomplicated: Secondary | ICD-10-CM | POA: Diagnosis not present

## 2021-04-06 DIAGNOSIS — Z87891 Personal history of nicotine dependence: Secondary | ICD-10-CM | POA: Diagnosis not present

## 2021-04-06 DIAGNOSIS — J309 Allergic rhinitis, unspecified: Secondary | ICD-10-CM | POA: Diagnosis not present

## 2021-04-06 DIAGNOSIS — E669 Obesity, unspecified: Secondary | ICD-10-CM | POA: Diagnosis not present

## 2021-04-21 DIAGNOSIS — J4550 Severe persistent asthma, uncomplicated: Secondary | ICD-10-CM | POA: Diagnosis not present

## 2021-04-22 DIAGNOSIS — I5032 Chronic diastolic (congestive) heart failure: Secondary | ICD-10-CM | POA: Diagnosis not present

## 2021-05-03 DIAGNOSIS — J449 Chronic obstructive pulmonary disease, unspecified: Secondary | ICD-10-CM | POA: Diagnosis not present

## 2021-05-03 DIAGNOSIS — I480 Paroxysmal atrial fibrillation: Secondary | ICD-10-CM | POA: Diagnosis not present

## 2021-05-03 DIAGNOSIS — Z8673 Personal history of transient ischemic attack (TIA), and cerebral infarction without residual deficits: Secondary | ICD-10-CM | POA: Diagnosis not present

## 2021-05-03 DIAGNOSIS — I10 Essential (primary) hypertension: Secondary | ICD-10-CM | POA: Diagnosis not present

## 2021-05-03 DIAGNOSIS — Z95818 Presence of other cardiac implants and grafts: Secondary | ICD-10-CM | POA: Diagnosis not present

## 2021-05-12 DIAGNOSIS — J4550 Severe persistent asthma, uncomplicated: Secondary | ICD-10-CM | POA: Diagnosis not present

## 2021-05-20 DIAGNOSIS — I480 Paroxysmal atrial fibrillation: Secondary | ICD-10-CM | POA: Diagnosis not present

## 2021-05-20 DIAGNOSIS — J4530 Mild persistent asthma, uncomplicated: Secondary | ICD-10-CM | POA: Diagnosis not present

## 2021-05-20 DIAGNOSIS — I10 Essential (primary) hypertension: Secondary | ICD-10-CM | POA: Diagnosis not present

## 2021-05-20 DIAGNOSIS — I5020 Unspecified systolic (congestive) heart failure: Secondary | ICD-10-CM | POA: Diagnosis not present

## 2021-05-23 DIAGNOSIS — I5032 Chronic diastolic (congestive) heart failure: Secondary | ICD-10-CM | POA: Diagnosis not present

## 2021-06-01 DIAGNOSIS — J4550 Severe persistent asthma, uncomplicated: Secondary | ICD-10-CM | POA: Diagnosis not present

## 2021-06-01 DIAGNOSIS — N183 Chronic kidney disease, stage 3 unspecified: Secondary | ICD-10-CM | POA: Diagnosis not present

## 2021-06-01 DIAGNOSIS — I5020 Unspecified systolic (congestive) heart failure: Secondary | ICD-10-CM | POA: Diagnosis not present

## 2021-06-01 DIAGNOSIS — Z6837 Body mass index (BMI) 37.0-37.9, adult: Secondary | ICD-10-CM | POA: Diagnosis not present

## 2021-06-01 DIAGNOSIS — M24112 Other articular cartilage disorders, left shoulder: Secondary | ICD-10-CM | POA: Diagnosis not present

## 2021-06-02 DIAGNOSIS — J4550 Severe persistent asthma, uncomplicated: Secondary | ICD-10-CM | POA: Diagnosis not present

## 2021-06-10 DIAGNOSIS — I129 Hypertensive chronic kidney disease with stage 1 through stage 4 chronic kidney disease, or unspecified chronic kidney disease: Secondary | ICD-10-CM | POA: Diagnosis not present

## 2021-06-10 DIAGNOSIS — I5042 Chronic combined systolic (congestive) and diastolic (congestive) heart failure: Secondary | ICD-10-CM | POA: Diagnosis not present

## 2021-06-10 DIAGNOSIS — N1831 Chronic kidney disease, stage 3a: Secondary | ICD-10-CM | POA: Diagnosis not present

## 2021-06-20 DIAGNOSIS — I5032 Chronic diastolic (congestive) heart failure: Secondary | ICD-10-CM | POA: Diagnosis not present

## 2021-06-28 DIAGNOSIS — Z95818 Presence of other cardiac implants and grafts: Secondary | ICD-10-CM | POA: Diagnosis not present

## 2021-06-28 DIAGNOSIS — I5020 Unspecified systolic (congestive) heart failure: Secondary | ICD-10-CM | POA: Diagnosis not present

## 2021-06-28 DIAGNOSIS — Z1331 Encounter for screening for depression: Secondary | ICD-10-CM | POA: Diagnosis not present

## 2021-06-28 DIAGNOSIS — J4550 Severe persistent asthma, uncomplicated: Secondary | ICD-10-CM | POA: Diagnosis not present

## 2021-06-28 DIAGNOSIS — I10 Essential (primary) hypertension: Secondary | ICD-10-CM | POA: Diagnosis not present

## 2021-06-28 DIAGNOSIS — I480 Paroxysmal atrial fibrillation: Secondary | ICD-10-CM | POA: Diagnosis not present

## 2021-06-28 DIAGNOSIS — Z1389 Encounter for screening for other disorder: Secondary | ICD-10-CM | POA: Diagnosis not present

## 2021-06-28 DIAGNOSIS — M24112 Other articular cartilage disorders, left shoulder: Secondary | ICD-10-CM | POA: Diagnosis not present

## 2021-06-29 DIAGNOSIS — J4550 Severe persistent asthma, uncomplicated: Secondary | ICD-10-CM | POA: Diagnosis not present

## 2021-07-21 DIAGNOSIS — I5032 Chronic diastolic (congestive) heart failure: Secondary | ICD-10-CM | POA: Diagnosis not present

## 2021-07-22 DIAGNOSIS — J4550 Severe persistent asthma, uncomplicated: Secondary | ICD-10-CM | POA: Diagnosis not present

## 2021-08-02 DIAGNOSIS — I471 Supraventricular tachycardia: Secondary | ICD-10-CM | POA: Diagnosis not present

## 2021-08-13 DIAGNOSIS — I4891 Unspecified atrial fibrillation: Secondary | ICD-10-CM | POA: Diagnosis not present

## 2021-08-16 DIAGNOSIS — J4550 Severe persistent asthma, uncomplicated: Secondary | ICD-10-CM | POA: Diagnosis not present

## 2021-08-16 DIAGNOSIS — R0609 Other forms of dyspnea: Secondary | ICD-10-CM | POA: Diagnosis not present

## 2021-08-16 DIAGNOSIS — Z87891 Personal history of nicotine dependence: Secondary | ICD-10-CM | POA: Diagnosis not present

## 2021-08-16 DIAGNOSIS — R6889 Other general symptoms and signs: Secondary | ICD-10-CM | POA: Diagnosis not present

## 2021-08-16 DIAGNOSIS — E669 Obesity, unspecified: Secondary | ICD-10-CM | POA: Diagnosis not present

## 2021-08-16 DIAGNOSIS — J309 Allergic rhinitis, unspecified: Secondary | ICD-10-CM | POA: Diagnosis not present

## 2021-08-16 DIAGNOSIS — R053 Chronic cough: Secondary | ICD-10-CM | POA: Diagnosis not present

## 2021-08-16 DIAGNOSIS — R1310 Dysphagia, unspecified: Secondary | ICD-10-CM | POA: Diagnosis not present

## 2021-08-20 DIAGNOSIS — I5032 Chronic diastolic (congestive) heart failure: Secondary | ICD-10-CM | POA: Diagnosis not present

## 2021-08-31 DIAGNOSIS — J4550 Severe persistent asthma, uncomplicated: Secondary | ICD-10-CM | POA: Diagnosis not present

## 2021-09-20 DIAGNOSIS — I5032 Chronic diastolic (congestive) heart failure: Secondary | ICD-10-CM | POA: Diagnosis not present

## 2021-09-21 DIAGNOSIS — J4550 Severe persistent asthma, uncomplicated: Secondary | ICD-10-CM | POA: Diagnosis not present

## 2021-09-21 DIAGNOSIS — R1310 Dysphagia, unspecified: Secondary | ICD-10-CM | POA: Diagnosis not present

## 2021-09-23 DIAGNOSIS — I447 Left bundle-branch block, unspecified: Secondary | ICD-10-CM | POA: Diagnosis not present

## 2021-09-23 DIAGNOSIS — I5042 Chronic combined systolic (congestive) and diastolic (congestive) heart failure: Secondary | ICD-10-CM | POA: Diagnosis not present

## 2021-09-23 DIAGNOSIS — I482 Chronic atrial fibrillation, unspecified: Secondary | ICD-10-CM | POA: Diagnosis not present

## 2021-09-23 DIAGNOSIS — I440 Atrioventricular block, first degree: Secondary | ICD-10-CM | POA: Diagnosis not present

## 2021-09-23 DIAGNOSIS — I110 Hypertensive heart disease with heart failure: Secondary | ICD-10-CM | POA: Diagnosis not present

## 2021-09-23 DIAGNOSIS — R9431 Abnormal electrocardiogram [ECG] [EKG]: Secondary | ICD-10-CM | POA: Diagnosis not present

## 2021-10-13 DIAGNOSIS — I129 Hypertensive chronic kidney disease with stage 1 through stage 4 chronic kidney disease, or unspecified chronic kidney disease: Secondary | ICD-10-CM | POA: Diagnosis not present

## 2021-10-13 DIAGNOSIS — N1832 Chronic kidney disease, stage 3b: Secondary | ICD-10-CM | POA: Diagnosis not present

## 2021-10-13 DIAGNOSIS — I5042 Chronic combined systolic (congestive) and diastolic (congestive) heart failure: Secondary | ICD-10-CM | POA: Diagnosis not present

## 2021-10-27 DIAGNOSIS — J4550 Severe persistent asthma, uncomplicated: Secondary | ICD-10-CM | POA: Diagnosis not present

## 2021-11-25 DIAGNOSIS — J4550 Severe persistent asthma, uncomplicated: Secondary | ICD-10-CM | POA: Diagnosis not present

## 2021-12-17 DIAGNOSIS — J4550 Severe persistent asthma, uncomplicated: Secondary | ICD-10-CM | POA: Diagnosis not present

## 2021-12-17 DIAGNOSIS — E669 Obesity, unspecified: Secondary | ICD-10-CM | POA: Diagnosis not present

## 2021-12-17 DIAGNOSIS — R6889 Other general symptoms and signs: Secondary | ICD-10-CM | POA: Diagnosis not present

## 2021-12-17 DIAGNOSIS — J309 Allergic rhinitis, unspecified: Secondary | ICD-10-CM | POA: Diagnosis not present

## 2021-12-17 DIAGNOSIS — R053 Chronic cough: Secondary | ICD-10-CM | POA: Diagnosis not present

## 2021-12-17 DIAGNOSIS — Z87891 Personal history of nicotine dependence: Secondary | ICD-10-CM | POA: Diagnosis not present

## 2021-12-17 DIAGNOSIS — R1310 Dysphagia, unspecified: Secondary | ICD-10-CM | POA: Diagnosis not present

## 2021-12-17 DIAGNOSIS — R0609 Other forms of dyspnea: Secondary | ICD-10-CM | POA: Diagnosis not present

## 2021-12-20 DIAGNOSIS — J4550 Severe persistent asthma, uncomplicated: Secondary | ICD-10-CM | POA: Diagnosis not present

## 2021-12-21 DIAGNOSIS — E7849 Other hyperlipidemia: Secondary | ICD-10-CM | POA: Diagnosis not present

## 2021-12-21 DIAGNOSIS — Z131 Encounter for screening for diabetes mellitus: Secondary | ICD-10-CM | POA: Diagnosis not present

## 2021-12-21 DIAGNOSIS — I10 Essential (primary) hypertension: Secondary | ICD-10-CM | POA: Diagnosis not present

## 2021-12-23 DIAGNOSIS — J4550 Severe persistent asthma, uncomplicated: Secondary | ICD-10-CM | POA: Diagnosis not present

## 2021-12-23 DIAGNOSIS — I10 Essential (primary) hypertension: Secondary | ICD-10-CM | POA: Diagnosis not present

## 2021-12-23 DIAGNOSIS — I480 Paroxysmal atrial fibrillation: Secondary | ICD-10-CM | POA: Diagnosis not present

## 2021-12-23 DIAGNOSIS — I700 Atherosclerosis of aorta: Secondary | ICD-10-CM | POA: Diagnosis not present

## 2021-12-23 DIAGNOSIS — I5020 Unspecified systolic (congestive) heart failure: Secondary | ICD-10-CM | POA: Diagnosis not present

## 2021-12-23 DIAGNOSIS — N183 Chronic kidney disease, stage 3 unspecified: Secondary | ICD-10-CM | POA: Diagnosis not present

## 2021-12-23 DIAGNOSIS — F332 Major depressive disorder, recurrent severe without psychotic features: Secondary | ICD-10-CM | POA: Diagnosis not present

## 2021-12-23 DIAGNOSIS — Z95818 Presence of other cardiac implants and grafts: Secondary | ICD-10-CM | POA: Diagnosis not present

## 2021-12-23 DIAGNOSIS — E7849 Other hyperlipidemia: Secondary | ICD-10-CM | POA: Diagnosis not present

## 2021-12-27 DIAGNOSIS — R1310 Dysphagia, unspecified: Secondary | ICD-10-CM | POA: Diagnosis not present

## 2022-01-03 DIAGNOSIS — R1310 Dysphagia, unspecified: Secondary | ICD-10-CM | POA: Diagnosis not present

## 2022-01-06 DIAGNOSIS — R5382 Chronic fatigue, unspecified: Secondary | ICD-10-CM | POA: Diagnosis not present

## 2022-01-06 DIAGNOSIS — E782 Mixed hyperlipidemia: Secondary | ICD-10-CM | POA: Diagnosis not present

## 2022-01-06 DIAGNOSIS — R739 Hyperglycemia, unspecified: Secondary | ICD-10-CM | POA: Diagnosis not present

## 2022-01-06 DIAGNOSIS — D62 Acute posthemorrhagic anemia: Secondary | ICD-10-CM | POA: Diagnosis not present

## 2022-01-06 DIAGNOSIS — I10 Essential (primary) hypertension: Secondary | ICD-10-CM | POA: Diagnosis not present

## 2022-01-06 DIAGNOSIS — N183 Chronic kidney disease, stage 3 unspecified: Secondary | ICD-10-CM | POA: Diagnosis not present

## 2022-01-06 DIAGNOSIS — E876 Hypokalemia: Secondary | ICD-10-CM | POA: Diagnosis not present

## 2022-01-06 DIAGNOSIS — N179 Acute kidney failure, unspecified: Secondary | ICD-10-CM | POA: Diagnosis not present

## 2022-01-06 DIAGNOSIS — M19049 Primary osteoarthritis, unspecified hand: Secondary | ICD-10-CM | POA: Diagnosis not present

## 2022-01-06 DIAGNOSIS — E039 Hypothyroidism, unspecified: Secondary | ICD-10-CM | POA: Diagnosis not present

## 2022-01-11 DIAGNOSIS — J4550 Severe persistent asthma, uncomplicated: Secondary | ICD-10-CM | POA: Diagnosis not present

## 2022-01-17 DIAGNOSIS — R1310 Dysphagia, unspecified: Secondary | ICD-10-CM | POA: Diagnosis not present

## 2022-02-01 DIAGNOSIS — J4550 Severe persistent asthma, uncomplicated: Secondary | ICD-10-CM | POA: Diagnosis not present

## 2022-02-01 DIAGNOSIS — R1310 Dysphagia, unspecified: Secondary | ICD-10-CM | POA: Diagnosis not present

## 2022-02-15 DIAGNOSIS — I129 Hypertensive chronic kidney disease with stage 1 through stage 4 chronic kidney disease, or unspecified chronic kidney disease: Secondary | ICD-10-CM | POA: Diagnosis not present

## 2022-02-15 DIAGNOSIS — N1832 Chronic kidney disease, stage 3b: Secondary | ICD-10-CM | POA: Diagnosis not present

## 2022-02-15 DIAGNOSIS — I5042 Chronic combined systolic (congestive) and diastolic (congestive) heart failure: Secondary | ICD-10-CM | POA: Diagnosis not present

## 2022-02-16 DIAGNOSIS — D692 Other nonthrombocytopenic purpura: Secondary | ICD-10-CM | POA: Diagnosis not present

## 2022-02-16 DIAGNOSIS — L821 Other seborrheic keratosis: Secondary | ICD-10-CM | POA: Diagnosis not present

## 2022-02-16 DIAGNOSIS — L578 Other skin changes due to chronic exposure to nonionizing radiation: Secondary | ICD-10-CM | POA: Diagnosis not present

## 2022-02-16 DIAGNOSIS — L812 Freckles: Secondary | ICD-10-CM | POA: Diagnosis not present

## 2022-02-16 DIAGNOSIS — L570 Actinic keratosis: Secondary | ICD-10-CM | POA: Diagnosis not present

## 2022-02-16 DIAGNOSIS — L820 Inflamed seborrheic keratosis: Secondary | ICD-10-CM | POA: Diagnosis not present

## 2022-06-13 DIAGNOSIS — J4530 Mild persistent asthma, uncomplicated: Secondary | ICD-10-CM | POA: Diagnosis not present

## 2022-06-13 DIAGNOSIS — I5042 Chronic combined systolic (congestive) and diastolic (congestive) heart failure: Secondary | ICD-10-CM | POA: Diagnosis not present

## 2022-06-13 DIAGNOSIS — Z7982 Long term (current) use of aspirin: Secondary | ICD-10-CM | POA: Diagnosis not present

## 2022-06-13 DIAGNOSIS — Z95818 Presence of other cardiac implants and grafts: Secondary | ICD-10-CM | POA: Diagnosis not present

## 2022-06-13 DIAGNOSIS — N1832 Chronic kidney disease, stage 3b: Secondary | ICD-10-CM | POA: Diagnosis not present

## 2022-06-13 DIAGNOSIS — I480 Paroxysmal atrial fibrillation: Secondary | ICD-10-CM | POA: Diagnosis not present

## 2022-06-13 DIAGNOSIS — I110 Hypertensive heart disease with heart failure: Secondary | ICD-10-CM | POA: Diagnosis not present

## 2022-06-16 DIAGNOSIS — I129 Hypertensive chronic kidney disease with stage 1 through stage 4 chronic kidney disease, or unspecified chronic kidney disease: Secondary | ICD-10-CM | POA: Diagnosis not present

## 2022-06-16 DIAGNOSIS — I5042 Chronic combined systolic (congestive) and diastolic (congestive) heart failure: Secondary | ICD-10-CM | POA: Diagnosis not present

## 2022-06-16 DIAGNOSIS — N1832 Chronic kidney disease, stage 3b: Secondary | ICD-10-CM | POA: Diagnosis not present

## 2022-07-18 DIAGNOSIS — E559 Vitamin D deficiency, unspecified: Secondary | ICD-10-CM | POA: Diagnosis not present

## 2022-07-18 DIAGNOSIS — D62 Acute posthemorrhagic anemia: Secondary | ICD-10-CM | POA: Diagnosis not present

## 2022-07-18 DIAGNOSIS — N183 Chronic kidney disease, stage 3 unspecified: Secondary | ICD-10-CM | POA: Diagnosis not present

## 2022-07-18 DIAGNOSIS — E782 Mixed hyperlipidemia: Secondary | ICD-10-CM | POA: Diagnosis not present

## 2022-07-18 DIAGNOSIS — R5383 Other fatigue: Secondary | ICD-10-CM | POA: Diagnosis not present

## 2022-07-18 DIAGNOSIS — Z1329 Encounter for screening for other suspected endocrine disorder: Secondary | ICD-10-CM | POA: Diagnosis not present

## 2022-07-18 DIAGNOSIS — E7849 Other hyperlipidemia: Secondary | ICD-10-CM | POA: Diagnosis not present

## 2022-07-18 DIAGNOSIS — E876 Hypokalemia: Secondary | ICD-10-CM | POA: Diagnosis not present

## 2022-07-18 DIAGNOSIS — Z1159 Encounter for screening for other viral diseases: Secondary | ICD-10-CM | POA: Diagnosis not present

## 2022-07-26 DIAGNOSIS — E876 Hypokalemia: Secondary | ICD-10-CM | POA: Diagnosis not present

## 2022-07-26 DIAGNOSIS — E7849 Other hyperlipidemia: Secondary | ICD-10-CM | POA: Diagnosis not present

## 2022-07-26 DIAGNOSIS — J4550 Severe persistent asthma, uncomplicated: Secondary | ICD-10-CM | POA: Diagnosis not present

## 2022-07-26 DIAGNOSIS — Z95818 Presence of other cardiac implants and grafts: Secondary | ICD-10-CM | POA: Diagnosis not present

## 2022-07-26 DIAGNOSIS — I700 Atherosclerosis of aorta: Secondary | ICD-10-CM | POA: Diagnosis not present

## 2022-07-26 DIAGNOSIS — F332 Major depressive disorder, recurrent severe without psychotic features: Secondary | ICD-10-CM | POA: Diagnosis not present

## 2022-07-26 DIAGNOSIS — I5020 Unspecified systolic (congestive) heart failure: Secondary | ICD-10-CM | POA: Diagnosis not present

## 2022-07-26 DIAGNOSIS — I10 Essential (primary) hypertension: Secondary | ICD-10-CM | POA: Diagnosis not present

## 2022-07-26 DIAGNOSIS — I480 Paroxysmal atrial fibrillation: Secondary | ICD-10-CM | POA: Diagnosis not present

## 2022-08-10 DIAGNOSIS — S39012D Strain of muscle, fascia and tendon of lower back, subsequent encounter: Secondary | ICD-10-CM | POA: Diagnosis not present

## 2022-08-15 DIAGNOSIS — S39012D Strain of muscle, fascia and tendon of lower back, subsequent encounter: Secondary | ICD-10-CM | POA: Diagnosis not present

## 2022-08-18 DIAGNOSIS — S39012D Strain of muscle, fascia and tendon of lower back, subsequent encounter: Secondary | ICD-10-CM | POA: Diagnosis not present

## 2022-08-26 DIAGNOSIS — S39012D Strain of muscle, fascia and tendon of lower back, subsequent encounter: Secondary | ICD-10-CM | POA: Diagnosis not present

## 2022-09-05 DIAGNOSIS — R079 Chest pain, unspecified: Secondary | ICD-10-CM | POA: Diagnosis not present

## 2022-09-05 DIAGNOSIS — I10 Essential (primary) hypertension: Secondary | ICD-10-CM | POA: Diagnosis not present

## 2022-09-05 DIAGNOSIS — E876 Hypokalemia: Secondary | ICD-10-CM | POA: Diagnosis not present

## 2022-09-05 DIAGNOSIS — J159 Unspecified bacterial pneumonia: Secondary | ICD-10-CM | POA: Diagnosis not present

## 2022-09-05 DIAGNOSIS — J45901 Unspecified asthma with (acute) exacerbation: Secondary | ICD-10-CM | POA: Diagnosis not present

## 2022-09-05 DIAGNOSIS — E871 Hypo-osmolality and hyponatremia: Secondary | ICD-10-CM | POA: Diagnosis not present

## 2022-09-05 DIAGNOSIS — N179 Acute kidney failure, unspecified: Secondary | ICD-10-CM | POA: Diagnosis not present

## 2022-09-05 DIAGNOSIS — E860 Dehydration: Secondary | ICD-10-CM | POA: Diagnosis not present

## 2022-09-05 DIAGNOSIS — J9601 Acute respiratory failure with hypoxia: Secondary | ICD-10-CM | POA: Diagnosis not present

## 2022-09-05 DIAGNOSIS — N1832 Chronic kidney disease, stage 3b: Secondary | ICD-10-CM | POA: Diagnosis not present

## 2022-09-05 DIAGNOSIS — I5042 Chronic combined systolic (congestive) and diastolic (congestive) heart failure: Secondary | ICD-10-CM | POA: Diagnosis not present

## 2022-09-05 DIAGNOSIS — I130 Hypertensive heart and chronic kidney disease with heart failure and stage 1 through stage 4 chronic kidney disease, or unspecified chronic kidney disease: Secondary | ICD-10-CM | POA: Diagnosis not present

## 2022-09-05 DIAGNOSIS — J189 Pneumonia, unspecified organism: Secondary | ICD-10-CM | POA: Diagnosis not present

## 2022-09-06 DIAGNOSIS — I5042 Chronic combined systolic (congestive) and diastolic (congestive) heart failure: Secondary | ICD-10-CM | POA: Diagnosis not present

## 2022-09-06 DIAGNOSIS — J45901 Unspecified asthma with (acute) exacerbation: Secondary | ICD-10-CM | POA: Diagnosis not present

## 2022-09-06 DIAGNOSIS — I130 Hypertensive heart and chronic kidney disease with heart failure and stage 1 through stage 4 chronic kidney disease, or unspecified chronic kidney disease: Secondary | ICD-10-CM | POA: Diagnosis not present

## 2022-09-06 DIAGNOSIS — I10 Essential (primary) hypertension: Secondary | ICD-10-CM | POA: Diagnosis not present

## 2022-09-06 DIAGNOSIS — J189 Pneumonia, unspecified organism: Secondary | ICD-10-CM | POA: Diagnosis not present

## 2022-09-06 DIAGNOSIS — E876 Hypokalemia: Secondary | ICD-10-CM | POA: Diagnosis not present

## 2022-09-06 DIAGNOSIS — E871 Hypo-osmolality and hyponatremia: Secondary | ICD-10-CM | POA: Diagnosis not present

## 2022-09-06 DIAGNOSIS — J9601 Acute respiratory failure with hypoxia: Secondary | ICD-10-CM | POA: Diagnosis not present

## 2022-09-06 DIAGNOSIS — N1832 Chronic kidney disease, stage 3b: Secondary | ICD-10-CM | POA: Diagnosis not present

## 2022-09-07 DIAGNOSIS — I5042 Chronic combined systolic (congestive) and diastolic (congestive) heart failure: Secondary | ICD-10-CM | POA: Diagnosis not present

## 2022-09-07 DIAGNOSIS — I10 Essential (primary) hypertension: Secondary | ICD-10-CM | POA: Diagnosis not present

## 2022-09-07 DIAGNOSIS — E871 Hypo-osmolality and hyponatremia: Secondary | ICD-10-CM | POA: Diagnosis not present

## 2022-09-07 DIAGNOSIS — J45901 Unspecified asthma with (acute) exacerbation: Secondary | ICD-10-CM | POA: Diagnosis not present

## 2022-09-07 DIAGNOSIS — J189 Pneumonia, unspecified organism: Secondary | ICD-10-CM | POA: Diagnosis not present

## 2022-09-07 DIAGNOSIS — J9601 Acute respiratory failure with hypoxia: Secondary | ICD-10-CM | POA: Diagnosis not present

## 2022-09-07 DIAGNOSIS — N1832 Chronic kidney disease, stage 3b: Secondary | ICD-10-CM | POA: Diagnosis not present

## 2022-09-07 DIAGNOSIS — I130 Hypertensive heart and chronic kidney disease with heart failure and stage 1 through stage 4 chronic kidney disease, or unspecified chronic kidney disease: Secondary | ICD-10-CM | POA: Diagnosis not present

## 2022-09-07 DIAGNOSIS — E876 Hypokalemia: Secondary | ICD-10-CM | POA: Diagnosis not present

## 2022-09-23 DIAGNOSIS — Z95818 Presence of other cardiac implants and grafts: Secondary | ICD-10-CM | POA: Diagnosis not present

## 2022-09-23 DIAGNOSIS — I5020 Unspecified systolic (congestive) heart failure: Secondary | ICD-10-CM | POA: Diagnosis not present

## 2022-09-23 DIAGNOSIS — J9601 Acute respiratory failure with hypoxia: Secondary | ICD-10-CM | POA: Diagnosis not present

## 2022-09-23 DIAGNOSIS — J4550 Severe persistent asthma, uncomplicated: Secondary | ICD-10-CM | POA: Diagnosis not present

## 2022-09-23 DIAGNOSIS — I480 Paroxysmal atrial fibrillation: Secondary | ICD-10-CM | POA: Diagnosis not present

## 2022-09-23 DIAGNOSIS — E7849 Other hyperlipidemia: Secondary | ICD-10-CM | POA: Diagnosis not present

## 2022-09-23 DIAGNOSIS — N179 Acute kidney failure, unspecified: Secondary | ICD-10-CM | POA: Diagnosis not present

## 2022-09-23 DIAGNOSIS — J189 Pneumonia, unspecified organism: Secondary | ICD-10-CM | POA: Diagnosis not present

## 2022-09-23 DIAGNOSIS — N1832 Chronic kidney disease, stage 3b: Secondary | ICD-10-CM | POA: Diagnosis not present

## 2022-09-27 DIAGNOSIS — I130 Hypertensive heart and chronic kidney disease with heart failure and stage 1 through stage 4 chronic kidney disease, or unspecified chronic kidney disease: Secondary | ICD-10-CM | POA: Diagnosis not present

## 2022-09-27 DIAGNOSIS — I4821 Permanent atrial fibrillation: Secondary | ICD-10-CM | POA: Diagnosis not present

## 2022-09-27 DIAGNOSIS — Z87891 Personal history of nicotine dependence: Secondary | ICD-10-CM | POA: Diagnosis not present

## 2022-09-27 DIAGNOSIS — I5042 Chronic combined systolic (congestive) and diastolic (congestive) heart failure: Secondary | ICD-10-CM | POA: Diagnosis not present

## 2022-09-27 DIAGNOSIS — N1832 Chronic kidney disease, stage 3b: Secondary | ICD-10-CM | POA: Diagnosis not present

## 2023-01-08 DIAGNOSIS — S9782XA Crushing injury of left foot, initial encounter: Secondary | ICD-10-CM | POA: Diagnosis not present

## 2023-01-08 DIAGNOSIS — W231XXA Caught, crushed, jammed, or pinched between stationary objects, initial encounter: Secondary | ICD-10-CM | POA: Diagnosis not present

## 2023-01-08 DIAGNOSIS — M79672 Pain in left foot: Secondary | ICD-10-CM | POA: Diagnosis not present

## 2023-01-10 DIAGNOSIS — N1832 Chronic kidney disease, stage 3b: Secondary | ICD-10-CM | POA: Diagnosis not present

## 2023-01-10 DIAGNOSIS — E7849 Other hyperlipidemia: Secondary | ICD-10-CM | POA: Diagnosis not present

## 2023-01-10 DIAGNOSIS — I10 Essential (primary) hypertension: Secondary | ICD-10-CM | POA: Diagnosis not present

## 2023-01-10 DIAGNOSIS — Z1329 Encounter for screening for other suspected endocrine disorder: Secondary | ICD-10-CM | POA: Diagnosis not present

## 2023-01-10 DIAGNOSIS — E559 Vitamin D deficiency, unspecified: Secondary | ICD-10-CM | POA: Diagnosis not present

## 2023-01-10 DIAGNOSIS — Z0001 Encounter for general adult medical examination with abnormal findings: Secondary | ICD-10-CM | POA: Diagnosis not present

## 2023-01-17 DIAGNOSIS — I700 Atherosclerosis of aorta: Secondary | ICD-10-CM | POA: Diagnosis not present

## 2023-01-17 DIAGNOSIS — J4550 Severe persistent asthma, uncomplicated: Secondary | ICD-10-CM | POA: Diagnosis not present

## 2023-01-17 DIAGNOSIS — I5020 Unspecified systolic (congestive) heart failure: Secondary | ICD-10-CM | POA: Diagnosis not present

## 2023-01-17 DIAGNOSIS — Z0001 Encounter for general adult medical examination with abnormal findings: Secondary | ICD-10-CM | POA: Diagnosis not present

## 2023-01-17 DIAGNOSIS — N1832 Chronic kidney disease, stage 3b: Secondary | ICD-10-CM | POA: Diagnosis not present

## 2023-01-17 DIAGNOSIS — E876 Hypokalemia: Secondary | ICD-10-CM | POA: Diagnosis not present

## 2023-01-17 DIAGNOSIS — F332 Major depressive disorder, recurrent severe without psychotic features: Secondary | ICD-10-CM | POA: Diagnosis not present

## 2023-01-17 DIAGNOSIS — Z95818 Presence of other cardiac implants and grafts: Secondary | ICD-10-CM | POA: Diagnosis not present

## 2023-01-17 DIAGNOSIS — I480 Paroxysmal atrial fibrillation: Secondary | ICD-10-CM | POA: Diagnosis not present

## 2023-01-27 DIAGNOSIS — Z1152 Encounter for screening for COVID-19: Secondary | ICD-10-CM | POA: Diagnosis not present

## 2023-01-27 DIAGNOSIS — Z79899 Other long term (current) drug therapy: Secondary | ICD-10-CM | POA: Diagnosis not present

## 2023-01-27 DIAGNOSIS — Z20822 Contact with and (suspected) exposure to covid-19: Secondary | ICD-10-CM | POA: Diagnosis not present

## 2023-01-27 DIAGNOSIS — J208 Acute bronchitis due to other specified organisms: Secondary | ICD-10-CM | POA: Diagnosis not present

## 2023-01-27 DIAGNOSIS — J441 Chronic obstructive pulmonary disease with (acute) exacerbation: Secondary | ICD-10-CM | POA: Diagnosis not present

## 2023-01-27 DIAGNOSIS — J45901 Unspecified asthma with (acute) exacerbation: Secondary | ICD-10-CM | POA: Diagnosis not present

## 2023-01-27 DIAGNOSIS — J209 Acute bronchitis, unspecified: Secondary | ICD-10-CM | POA: Diagnosis not present

## 2023-01-27 DIAGNOSIS — I517 Cardiomegaly: Secondary | ICD-10-CM | POA: Diagnosis not present

## 2023-01-27 DIAGNOSIS — R059 Cough, unspecified: Secondary | ICD-10-CM | POA: Diagnosis not present

## 2023-01-27 DIAGNOSIS — R0989 Other specified symptoms and signs involving the circulatory and respiratory systems: Secondary | ICD-10-CM | POA: Diagnosis not present

## 2023-05-05 DIAGNOSIS — Z87891 Personal history of nicotine dependence: Secondary | ICD-10-CM | POA: Diagnosis not present

## 2023-05-05 DIAGNOSIS — E669 Obesity, unspecified: Secondary | ICD-10-CM | POA: Diagnosis not present

## 2023-05-05 DIAGNOSIS — R6889 Other general symptoms and signs: Secondary | ICD-10-CM | POA: Diagnosis not present

## 2023-05-05 DIAGNOSIS — R0609 Other forms of dyspnea: Secondary | ICD-10-CM | POA: Diagnosis not present

## 2023-05-05 DIAGNOSIS — M069 Rheumatoid arthritis, unspecified: Secondary | ICD-10-CM | POA: Diagnosis not present

## 2023-05-05 DIAGNOSIS — R053 Chronic cough: Secondary | ICD-10-CM | POA: Diagnosis not present

## 2023-05-05 DIAGNOSIS — J309 Allergic rhinitis, unspecified: Secondary | ICD-10-CM | POA: Diagnosis not present

## 2023-05-05 DIAGNOSIS — J4550 Severe persistent asthma, uncomplicated: Secondary | ICD-10-CM | POA: Diagnosis not present

## 2023-05-05 DIAGNOSIS — R1310 Dysphagia, unspecified: Secondary | ICD-10-CM | POA: Diagnosis not present

## 2023-05-28 DIAGNOSIS — J441 Chronic obstructive pulmonary disease with (acute) exacerbation: Secondary | ICD-10-CM | POA: Diagnosis not present

## 2023-05-28 DIAGNOSIS — J159 Unspecified bacterial pneumonia: Secondary | ICD-10-CM | POA: Diagnosis not present

## 2023-05-30 DIAGNOSIS — N1832 Chronic kidney disease, stage 3b: Secondary | ICD-10-CM | POA: Diagnosis not present

## 2023-05-30 DIAGNOSIS — I5042 Chronic combined systolic (congestive) and diastolic (congestive) heart failure: Secondary | ICD-10-CM | POA: Diagnosis not present

## 2023-05-30 DIAGNOSIS — R778 Other specified abnormalities of plasma proteins: Secondary | ICD-10-CM | POA: Diagnosis not present

## 2023-05-30 DIAGNOSIS — I130 Hypertensive heart and chronic kidney disease with heart failure and stage 1 through stage 4 chronic kidney disease, or unspecified chronic kidney disease: Secondary | ICD-10-CM | POA: Diagnosis not present

## 2023-05-30 DIAGNOSIS — I480 Paroxysmal atrial fibrillation: Secondary | ICD-10-CM | POA: Diagnosis not present

## 2023-05-30 DIAGNOSIS — Z7982 Long term (current) use of aspirin: Secondary | ICD-10-CM | POA: Diagnosis not present

## 2023-05-30 DIAGNOSIS — Z95818 Presence of other cardiac implants and grafts: Secondary | ICD-10-CM | POA: Diagnosis not present

## 2023-05-30 DIAGNOSIS — Z87891 Personal history of nicotine dependence: Secondary | ICD-10-CM | POA: Diagnosis not present

## 2023-05-30 DIAGNOSIS — I440 Atrioventricular block, first degree: Secondary | ICD-10-CM | POA: Diagnosis not present

## 2023-07-17 DIAGNOSIS — Z6837 Body mass index (BMI) 37.0-37.9, adult: Secondary | ICD-10-CM | POA: Diagnosis not present

## 2023-07-17 DIAGNOSIS — M7542 Impingement syndrome of left shoulder: Secondary | ICD-10-CM | POA: Diagnosis not present

## 2023-07-17 DIAGNOSIS — M24112 Other articular cartilage disorders, left shoulder: Secondary | ICD-10-CM | POA: Diagnosis not present

## 2023-07-17 DIAGNOSIS — M7502 Adhesive capsulitis of left shoulder: Secondary | ICD-10-CM | POA: Diagnosis not present

## 2023-07-17 DIAGNOSIS — N1832 Chronic kidney disease, stage 3b: Secondary | ICD-10-CM | POA: Diagnosis not present

## 2023-07-19 DIAGNOSIS — Z1329 Encounter for screening for other suspected endocrine disorder: Secondary | ICD-10-CM | POA: Diagnosis not present

## 2023-07-19 DIAGNOSIS — E559 Vitamin D deficiency, unspecified: Secondary | ICD-10-CM | POA: Diagnosis not present

## 2023-07-19 DIAGNOSIS — Z131 Encounter for screening for diabetes mellitus: Secondary | ICD-10-CM | POA: Diagnosis not present

## 2023-07-19 DIAGNOSIS — Z13220 Encounter for screening for lipoid disorders: Secondary | ICD-10-CM | POA: Diagnosis not present

## 2023-07-19 DIAGNOSIS — N179 Acute kidney failure, unspecified: Secondary | ICD-10-CM | POA: Diagnosis not present

## 2023-07-19 DIAGNOSIS — N1832 Chronic kidney disease, stage 3b: Secondary | ICD-10-CM | POA: Diagnosis not present

## 2023-07-26 DIAGNOSIS — Z6837 Body mass index (BMI) 37.0-37.9, adult: Secondary | ICD-10-CM | POA: Diagnosis not present

## 2023-07-26 DIAGNOSIS — E782 Mixed hyperlipidemia: Secondary | ICD-10-CM | POA: Diagnosis not present

## 2023-07-26 DIAGNOSIS — I480 Paroxysmal atrial fibrillation: Secondary | ICD-10-CM | POA: Diagnosis not present

## 2023-07-26 DIAGNOSIS — M24112 Other articular cartilage disorders, left shoulder: Secondary | ICD-10-CM | POA: Diagnosis not present

## 2023-07-26 DIAGNOSIS — J4520 Mild intermittent asthma, uncomplicated: Secondary | ICD-10-CM | POA: Diagnosis not present

## 2023-11-15 DIAGNOSIS — R1310 Dysphagia, unspecified: Secondary | ICD-10-CM | POA: Diagnosis not present

## 2023-11-15 DIAGNOSIS — E669 Obesity, unspecified: Secondary | ICD-10-CM | POA: Diagnosis not present

## 2023-11-15 DIAGNOSIS — J4550 Severe persistent asthma, uncomplicated: Secondary | ICD-10-CM | POA: Diagnosis not present

## 2023-11-15 DIAGNOSIS — J309 Allergic rhinitis, unspecified: Secondary | ICD-10-CM | POA: Diagnosis not present

## 2023-11-15 DIAGNOSIS — R6889 Other general symptoms and signs: Secondary | ICD-10-CM | POA: Diagnosis not present

## 2023-11-15 DIAGNOSIS — R053 Chronic cough: Secondary | ICD-10-CM | POA: Diagnosis not present

## 2023-11-15 DIAGNOSIS — R0609 Other forms of dyspnea: Secondary | ICD-10-CM | POA: Diagnosis not present

## 2023-11-15 DIAGNOSIS — Z87891 Personal history of nicotine dependence: Secondary | ICD-10-CM | POA: Diagnosis not present

## 2024-01-22 DIAGNOSIS — Z23 Encounter for immunization: Secondary | ICD-10-CM | POA: Diagnosis not present

## 2024-01-22 DIAGNOSIS — N1832 Chronic kidney disease, stage 3b: Secondary | ICD-10-CM | POA: Diagnosis not present

## 2024-01-22 DIAGNOSIS — I700 Atherosclerosis of aorta: Secondary | ICD-10-CM | POA: Diagnosis not present

## 2024-01-22 DIAGNOSIS — Z0001 Encounter for general adult medical examination with abnormal findings: Secondary | ICD-10-CM | POA: Diagnosis not present

## 2024-01-22 DIAGNOSIS — I5020 Unspecified systolic (congestive) heart failure: Secondary | ICD-10-CM | POA: Diagnosis not present

## 2024-01-22 DIAGNOSIS — F332 Major depressive disorder, recurrent severe without psychotic features: Secondary | ICD-10-CM | POA: Diagnosis not present

## 2024-01-22 DIAGNOSIS — Z1331 Encounter for screening for depression: Secondary | ICD-10-CM | POA: Diagnosis not present

## 2024-01-22 DIAGNOSIS — I480 Paroxysmal atrial fibrillation: Secondary | ICD-10-CM | POA: Diagnosis not present

## 2024-01-22 DIAGNOSIS — I10 Essential (primary) hypertension: Secondary | ICD-10-CM | POA: Diagnosis not present

## 2024-01-30 DIAGNOSIS — I10 Essential (primary) hypertension: Secondary | ICD-10-CM | POA: Diagnosis not present

## 2024-01-30 DIAGNOSIS — I480 Paroxysmal atrial fibrillation: Secondary | ICD-10-CM | POA: Diagnosis not present

## 2024-01-30 DIAGNOSIS — N1832 Chronic kidney disease, stage 3b: Secondary | ICD-10-CM | POA: Diagnosis not present

## 2024-01-30 DIAGNOSIS — J4530 Mild persistent asthma, uncomplicated: Secondary | ICD-10-CM | POA: Diagnosis not present

## 2024-01-30 DIAGNOSIS — R0609 Other forms of dyspnea: Secondary | ICD-10-CM | POA: Diagnosis not present

## 2024-01-30 DIAGNOSIS — I5042 Chronic combined systolic (congestive) and diastolic (congestive) heart failure: Secondary | ICD-10-CM | POA: Diagnosis not present

## 2024-02-12 DIAGNOSIS — I5189 Other ill-defined heart diseases: Secondary | ICD-10-CM | POA: Diagnosis not present

## 2024-02-12 DIAGNOSIS — I340 Nonrheumatic mitral (valve) insufficiency: Secondary | ICD-10-CM | POA: Diagnosis not present

## 2024-02-20 DIAGNOSIS — R051 Acute cough: Secondary | ICD-10-CM | POA: Diagnosis not present

## 2024-02-20 DIAGNOSIS — J019 Acute sinusitis, unspecified: Secondary | ICD-10-CM | POA: Diagnosis not present

## 2024-02-20 DIAGNOSIS — R0982 Postnasal drip: Secondary | ICD-10-CM | POA: Diagnosis not present

## 2024-02-20 DIAGNOSIS — Z6838 Body mass index (BMI) 38.0-38.9, adult: Secondary | ICD-10-CM | POA: Diagnosis not present

## 2024-02-20 DIAGNOSIS — I10 Essential (primary) hypertension: Secondary | ICD-10-CM | POA: Diagnosis not present
